# Patient Record
Sex: Female | Born: 1965 | Race: White | Hispanic: No | Marital: Married | Smoking: Never smoker
Health system: Southern US, Community
[De-identification: ages and names within clinical notes are randomized; demographics above are authoritative.]

## PROBLEM LIST (undated history)

## (undated) DIAGNOSIS — Z8742 Personal history of other diseases of the female genital tract: Secondary | ICD-10-CM

## (undated) DIAGNOSIS — F909 Attention-deficit hyperactivity disorder, unspecified type: Secondary | ICD-10-CM

## (undated) DIAGNOSIS — F329 Major depressive disorder, single episode, unspecified: Secondary | ICD-10-CM

## (undated) DIAGNOSIS — F32A Depression, unspecified: Secondary | ICD-10-CM

## (undated) DIAGNOSIS — Z973 Presence of spectacles and contact lenses: Secondary | ICD-10-CM

## (undated) DIAGNOSIS — G43909 Migraine, unspecified, not intractable, without status migrainosus: Secondary | ICD-10-CM

## (undated) DIAGNOSIS — Z8669 Personal history of other diseases of the nervous system and sense organs: Secondary | ICD-10-CM

## (undated) DIAGNOSIS — I10 Essential (primary) hypertension: Secondary | ICD-10-CM

## (undated) DIAGNOSIS — J45909 Unspecified asthma, uncomplicated: Secondary | ICD-10-CM

## (undated) DIAGNOSIS — B019 Varicella without complication: Secondary | ICD-10-CM

## (undated) HISTORY — DX: Depression, unspecified: F32.A

## (undated) HISTORY — DX: Varicella without complication: B01.9

## (undated) HISTORY — DX: Migraine, unspecified, not intractable, without status migrainosus: G43.909

## (undated) HISTORY — DX: Major depressive disorder, single episode, unspecified: F32.9

## (undated) HISTORY — DX: Unspecified asthma, uncomplicated: J45.909

## (undated) HISTORY — DX: Essential (primary) hypertension: I10

## (undated) HISTORY — PX: TONSILLECTOMY: SUR1361

---
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
## 1997-11-10 ENCOUNTER — Inpatient Hospital Stay (HOSPITAL_COMMUNITY): Admission: AD | Admit: 1997-11-10 | Discharge: 1997-11-13 | Payer: Self-pay | Admitting: *Deleted

## 1997-11-15 ENCOUNTER — Encounter (HOSPITAL_COMMUNITY): Admission: RE | Admit: 1997-11-15 | Discharge: 1998-02-13 | Payer: Self-pay | Admitting: *Deleted

## 1997-11-16 ENCOUNTER — Inpatient Hospital Stay (HOSPITAL_COMMUNITY): Admission: EM | Admit: 1997-11-16 | Discharge: 1997-11-18 | Payer: Self-pay | Admitting: Emergency Medicine

## 1997-12-17 ENCOUNTER — Other Ambulatory Visit: Admission: RE | Admit: 1997-12-17 | Discharge: 1997-12-17 | Payer: Self-pay | Admitting: Obstetrics and Gynecology

## 1999-07-20 ENCOUNTER — Other Ambulatory Visit: Admission: RE | Admit: 1999-07-20 | Discharge: 1999-07-20 | Payer: Self-pay | Admitting: *Deleted

## 2000-08-27 ENCOUNTER — Other Ambulatory Visit: Admission: RE | Admit: 2000-08-27 | Discharge: 2000-08-27 | Payer: Self-pay | Admitting: Obstetrics and Gynecology

## 2001-09-02 ENCOUNTER — Other Ambulatory Visit: Admission: RE | Admit: 2001-09-02 | Discharge: 2001-09-02 | Payer: Self-pay | Admitting: Obstetrics and Gynecology

## 2003-04-27 ENCOUNTER — Other Ambulatory Visit: Admission: RE | Admit: 2003-04-27 | Discharge: 2003-04-27 | Payer: Self-pay | Admitting: Obstetrics and Gynecology

## 2004-06-08 ENCOUNTER — Ambulatory Visit: Payer: Self-pay | Admitting: Gastroenterology

## 2005-11-09 ENCOUNTER — Ambulatory Visit: Payer: Self-pay | Admitting: Unknown Physician Specialty

## 2006-02-21 ENCOUNTER — Ambulatory Visit (HOSPITAL_BASED_OUTPATIENT_CLINIC_OR_DEPARTMENT_OTHER): Admission: RE | Admit: 2006-02-21 | Discharge: 2006-02-21 | Payer: Self-pay | Admitting: Obstetrics and Gynecology

## 2007-12-31 ENCOUNTER — Ambulatory Visit: Payer: Self-pay | Admitting: Specialist

## 2014-09-23 ENCOUNTER — Emergency Department: Payer: Self-pay | Admitting: Emergency Medicine

## 2015-03-15 ENCOUNTER — Encounter: Payer: Self-pay | Admitting: Nurse Practitioner

## 2015-03-15 ENCOUNTER — Encounter (INDEPENDENT_AMBULATORY_CARE_PROVIDER_SITE_OTHER): Payer: Self-pay

## 2015-03-15 ENCOUNTER — Ambulatory Visit (INDEPENDENT_AMBULATORY_CARE_PROVIDER_SITE_OTHER): Payer: BC Managed Care – PPO | Admitting: Nurse Practitioner

## 2015-03-15 VITALS — BP 170/112 | HR 83 | Temp 97.9°F | Resp 14 | Ht 62.0 in | Wt 107.4 lb

## 2015-03-15 DIAGNOSIS — Z0000 Encounter for general adult medical examination without abnormal findings: Secondary | ICD-10-CM | POA: Insufficient documentation

## 2015-03-15 DIAGNOSIS — Z7689 Persons encountering health services in other specified circumstances: Secondary | ICD-10-CM

## 2015-03-15 DIAGNOSIS — R229 Localized swelling, mass and lump, unspecified: Secondary | ICD-10-CM | POA: Diagnosis not present

## 2015-03-15 DIAGNOSIS — G43809 Other migraine, not intractable, without status migrainosus: Secondary | ICD-10-CM

## 2015-03-15 DIAGNOSIS — F909 Attention-deficit hyperactivity disorder, unspecified type: Secondary | ICD-10-CM | POA: Diagnosis not present

## 2015-03-15 DIAGNOSIS — Z7189 Other specified counseling: Secondary | ICD-10-CM

## 2015-03-15 DIAGNOSIS — IMO0001 Reserved for inherently not codable concepts without codable children: Secondary | ICD-10-CM

## 2015-03-15 DIAGNOSIS — R030 Elevated blood-pressure reading, without diagnosis of hypertension: Secondary | ICD-10-CM

## 2015-03-15 MED ORDER — AMLODIPINE BESYLATE 5 MG PO TABS: 5.0000 mg | ORAL_TABLET | Freq: Every day | ORAL | Status: DC

## 2015-03-15 MED ORDER — RIZATRIPTAN BENZOATE 5 MG PO TBDP: 5.0000 mg | ORAL_TABLET | ORAL | Status: DC | PRN

## 2015-03-15 NOTE — Progress Notes (Signed)
Subjective:    Patient ID: Katherine Sharp, female    DOB: 1965/08/15, 49 y.o.   MRN: [id number]  HPI  Katherine Sharp is a 49 yo female establishing care and a CC of knots on fingers and left elbow x a few years ago.   1) New pt info-   Immunizations- tdap unsure of date  Mammogram- 03/18/15 scheduled   Pap- 2014 normal per pt- [facility]  Eye Exam- 09/11/14, normal (glasses/contacts)   Dental Exam- UTD  2) Chronic Problems-  Depression- Dr. Robina Ade every 6 mos, doing well on Lexapro   ADHD- Adderall through Dr. Robina Ade   Migraines- left side primarily, eye to back of head. Tried Maxalt with relief in the past, no auras she reports.   Asthma- No recent exacerbations   HTN- rarely high she reports   3) Acute Problems-  Knots on fingers- left 2nd finger, right hand long finger sore if picking something up. Hx of cysts in different places on body. Right hand dominant   Review of Systems  Constitutional: Negative for fever, chills, diaphoresis and fatigue.  Respiratory: Negative for chest tightness, shortness of breath and wheezing.   Cardiovascular: Negative for chest pain, palpitations and leg swelling.  Gastrointestinal: Negative for nausea, vomiting and diarrhea.  Skin: Negative for rash.  Neurological: Positive for headaches. Negative for dizziness, weakness and numbness.  Psychiatric/Behavioral: The patient is not nervous/anxious.    Past Medical History  Diagnosis Date  . Asthma   . Depression   . Hypertension   . Chicken pox   . Migraines     History   Social History  . Marital Status: Married    Spouse Name: N/A  . Number of Children: N/A  . Years of Education: N/A   Occupational History  . Not on file.   Social History Main Topics  . Smoking status: Never Smoker   . Smokeless tobacco: Never Used  . Alcohol Use: 4.2 oz/week    7 Glasses of wine per week  . Drug Use: No  . Sexual Activity:    Partners: Male    Birth Control/ Protection: Surgical     Comment:  Vasectomy   Other Topics Concern  . 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    Pets: 3 cats inside and 1 dog inside        Past Surgical History  Procedure Laterality Date  . Appendectomy  1984  . Tonsillectomy and adenoidectomy  1970    Family History  Problem Relation Age of Onset  . Early death Mother     Brain Tumor  . Cancer Maternal Grandmother     Uterus, Bladder, Lung and Colon    Allergies  Allergen Reactions  . Nsaids Nausea Only    Abdominal pains and indigestion    No current outpatient prescriptions on file prior to visit.   No current facility-administered medications on file prior to visit.      Objective:   Physical Exam  Constitutional: She is oriented to person, place, and time. She appears well-developed and well-nourished. No distress.  BP 170/112 mmHg  Pulse 83  Temp(Src) 97.9 F (36.6 C)  Resp 14  Ht 5\' 2"  (1.575 m)  Wt 107 lb 6.4 oz (48.716 kg)  BMI 19.64 kg/m2  SpO2 99%   HENT:  Head: Normocephalic and atraumatic.    Right Ear: External ear normal.  Left Ear: External ear normal.  Cardiovascular: Normal rate, regular rhythm and normal heart sounds.   Pulmonary/Chest: Effort normal and breath sounds normal. No respiratory distress. She has no wheezes. She has no rales. She exhibits no tenderness.  Musculoskeletal: Normal range of motion. She exhibits no edema or tenderness.       Arms: Neurological: She is alert and oriented to person, place, and time. No cranial nerve deficit. She exhibits normal muscle tone. Coordination normal.  Skin: Skin is warm and dry. No rash noted. She is not diaphoretic.  Psychiatric: She has a normal mood and affect. Her behavior is normal. Judgment and thought content normal.      Assessment & Plan:

## 2015-03-15 NOTE — Patient Instructions (Signed)
Please make an appointment for a physical (you will have fasting labs- nothing to eat or drink after midnight except water and black coffee).   Welcome to [facility]! Nice to meet you!

## 2015-03-15 NOTE — Assessment & Plan Note (Signed)
Discussed acute and chronic issues. Reviewed health maintenance measures, PFSHx, and immunizations. Obtain records from previous facilities.

## 2015-03-15 NOTE — Assessment & Plan Note (Addendum)
Nodules stable on left hand DIP joint 2nd finger, DIP joint on right long finger, right shoulder under the skin, 1 removed on left lateral bridge of nose. Cysts are all 2 mm approx (look to be mucous cysts). Will follow.

## 2015-03-15 NOTE — Progress Notes (Signed)
Pre visit review using our clinic review tool, if applicable. No additional management support is needed unless otherwise documented below in the visit note. 

## 2015-03-18 LAB — HM MAMMOGRAPHY: HM Mammogram: NEGATIVE

## 2015-03-22 DIAGNOSIS — G43909 Migraine, unspecified, not intractable, without status migrainosus: Secondary | ICD-10-CM | POA: Insufficient documentation

## 2015-03-22 DIAGNOSIS — I10 Essential (primary) hypertension: Secondary | ICD-10-CM | POA: Insufficient documentation

## 2015-03-22 DIAGNOSIS — F909 Attention-deficit hyperactivity disorder, unspecified type: Secondary | ICD-10-CM | POA: Insufficient documentation

## 2015-03-22 NOTE — Assessment & Plan Note (Addendum)
Pt sees Dr. Toy Care. Stable on current medications.

## 2015-03-22 NOTE — Assessment & Plan Note (Signed)
Stable currently. Maxalt is helpful for pt. Will follow.

## 2015-03-22 NOTE — Assessment & Plan Note (Signed)
Rarely high pt reports. Today it is really elevated. Will start on Norvasc 5 mg and FU in 1 week.

## 2015-03-25 ENCOUNTER — Ambulatory Visit (INDEPENDENT_AMBULATORY_CARE_PROVIDER_SITE_OTHER): Payer: BC Managed Care – PPO

## 2015-03-25 ENCOUNTER — Telehealth: Payer: Self-pay

## 2015-03-25 VITALS — BP 124/90 | HR 76

## 2015-03-25 DIAGNOSIS — I10 Essential (primary) hypertension: Secondary | ICD-10-CM

## 2015-03-25 DIAGNOSIS — R030 Elevated blood-pressure reading, without diagnosis of hypertension: Secondary | ICD-10-CM

## 2015-03-25 DIAGNOSIS — IMO0001 Reserved for inherently not codable concepts without codable children: Secondary | ICD-10-CM

## 2015-03-25 NOTE — Telephone Encounter (Signed)
Pt came in for a BP check. Her BP was 124/90, pulse 76. Her mom bought her a BP machine and she has been taking it at home and her numbers were looking pretty good. She wanted to let you know that her doctor told her to stop taking her Adderall because she was thinking this is why her BP is running high. She is wondering if she needs to keep taking the BP meds. 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

## 2015-03-25 NOTE — Progress Notes (Signed)
Pt presented for blood pressure check. BP was 124/90 after and pulse was 76 after sitting for 5 minutes.

## 2015-03-26 NOTE — Addendum Note (Signed)
Addended by: Rubbie Battiest on: 03/26/2015 02:09 PM   Modules accepted: Miquel Dunn

## 2015-03-26 NOTE — Telephone Encounter (Signed)
LMTCB about BP meds

## 2015-03-26 NOTE — Progress Notes (Signed)
I agree with Ernestene Mention note. Will send a quick note regarding further instructions.   Lorane Gell, AGNP-C 03/26/15

## 2015-03-26 NOTE — Telephone Encounter (Signed)
We can try a higher dosage of her BP medication (since she's at a low dose) and see if that works with her Adderall. She will still need to take her BP medication regardless.

## 2015-03-29 NOTE — Telephone Encounter (Signed)
Pt is in [place] and will be back on Wednesday and is going to talk to Dr Robina Ade about her Adderall and get back to [place] on Wednesday.

## 2015-03-29 NOTE — Telephone Encounter (Signed)
LMTCB about medications

## 2015-05-04 ENCOUNTER — Telehealth: Payer: Self-pay | Admitting: Nurse Practitioner

## 2015-05-04 ENCOUNTER — Other Ambulatory Visit: Payer: Self-pay | Admitting: Nurse Practitioner

## 2015-05-04 DIAGNOSIS — Z0000 Encounter for general adult medical examination without abnormal findings: Secondary | ICD-10-CM

## 2015-05-04 NOTE — Telephone Encounter (Signed)
Orders placed. Thanks.

## 2015-05-04 NOTE — Telephone Encounter (Signed)
Pt states she needs to get fasting lab work. Need orders please. Thank You!

## 2015-05-10 ENCOUNTER — Other Ambulatory Visit (INDEPENDENT_AMBULATORY_CARE_PROVIDER_SITE_OTHER): Payer: BC Managed Care – PPO

## 2015-05-10 DIAGNOSIS — Z0000 Encounter for general adult medical examination without abnormal findings: Secondary | ICD-10-CM

## 2015-05-10 LAB — CBC WITH DIFFERENTIAL/PLATELET
BASOS ABS: 0 10*3/uL (ref 0.0–0.1)
BASOS PCT: 0.7 % (ref 0.0–3.0)
Eosinophils Absolute: 0.1 10*3/uL (ref 0.0–0.7)
Eosinophils Relative: 1.4 % (ref 0.0–5.0)
HCT: 37.5 % (ref 36.0–46.0)
Hemoglobin: 12.6 g/dL (ref 12.0–15.0)
LYMPHS ABS: 1 10*3/uL (ref 0.7–4.0)
Lymphocytes Relative: 25.7 % (ref 12.0–46.0)
MCHC: 33.6 g/dL (ref 30.0–36.0)
MCV: 84.7 fl (ref 78.0–100.0)
MONOS PCT: 7.5 % (ref 3.0–12.0)
Monocytes Absolute: 0.3 10*3/uL (ref 0.1–1.0)
NEUTROS ABS: 2.6 10*3/uL (ref 1.4–7.7)
NEUTROS PCT: 64.7 % (ref 43.0–77.0)
PLATELETS: 194 10*3/uL (ref 150.0–400.0)
RBC: 4.43 Mil/uL (ref 3.87–5.11)
RDW: 14.7 % (ref 11.5–15.5)
WBC: 4.1 10*3/uL (ref 4.0–10.5)

## 2015-05-10 LAB — LIPID PANEL
CHOL/HDL RATIO: 2
CHOLESTEROL: 175 mg/dL (ref 0–200)
HDL: 76 mg/dL (ref >39.00)
LDL Cholesterol: 84 mg/dL (ref 0–99)
NonHDL: 98.93
TRIGLYCERIDES: 75 mg/dL (ref 0.0–149.0)
VLDL: 15 mg/dL (ref 0.0–40.0)

## 2015-05-10 LAB — COMPREHENSIVE METABOLIC PANEL
ALBUMIN: 4.5 g/dL (ref 3.5–5.2)
ALT: 13 U/L (ref 0–35)
AST: 19 U/L (ref 0–37)
Alkaline Phosphatase: 42 U/L (ref 39–117)
BUN: 13 mg/dL (ref 6–23)
CHLORIDE: 105 meq/L (ref 96–112)
CO2: 26 mEq/L (ref 19–32)
CREATININE: 0.69 mg/dL (ref 0.40–1.20)
Calcium: 9.1 mg/dL (ref 8.4–10.5)
GFR: 95.88 mL/min (ref >60.00)
GLUCOSE: 88 mg/dL (ref 70–99)
Potassium: 4.7 mEq/L (ref 3.5–5.1)
SODIUM: 139 meq/L (ref 135–145)
TOTAL PROTEIN: 6.6 g/dL (ref 6.0–8.3)
Total Bilirubin: 0.6 mg/dL (ref 0.2–1.2)

## 2015-05-10 LAB — TSH: TSH: 0.84 u[IU]/mL (ref 0.35–4.50)

## 2015-05-11 ENCOUNTER — Other Ambulatory Visit: Payer: BC Managed Care – PPO

## 2015-07-09 ENCOUNTER — Other Ambulatory Visit: Payer: Self-pay | Admitting: Nurse Practitioner

## 2015-08-16 ENCOUNTER — Other Ambulatory Visit: Payer: Self-pay | Admitting: Nurse Practitioner

## 2015-08-20 ENCOUNTER — Ambulatory Visit (INDEPENDENT_AMBULATORY_CARE_PROVIDER_SITE_OTHER): Payer: BC Managed Care – PPO | Admitting: Nurse Practitioner

## 2015-08-20 ENCOUNTER — Ambulatory Visit: Payer: BC Managed Care – PPO | Admitting: Nurse Practitioner

## 2015-08-20 VITALS — BP 138/90 | HR 87 | Temp 98.7°F | Ht 62.0 in | Wt 115.0 lb

## 2015-08-20 DIAGNOSIS — F909 Attention-deficit hyperactivity disorder, unspecified type: Secondary | ICD-10-CM | POA: Diagnosis not present

## 2015-08-20 DIAGNOSIS — R6884 Jaw pain: Secondary | ICD-10-CM | POA: Diagnosis not present

## 2015-08-20 DIAGNOSIS — R030 Elevated blood-pressure reading, without diagnosis of hypertension: Secondary | ICD-10-CM | POA: Diagnosis not present

## 2015-08-20 DIAGNOSIS — IMO0001 Reserved for inherently not codable concepts without codable children: Secondary | ICD-10-CM

## 2015-08-20 MED ORDER — HYDROCHLOROTHIAZIDE 12.5 MG PO TABS: 12.5000 mg | ORAL_TABLET | Freq: Every day | ORAL | Status: DC

## 2015-08-20 MED ORDER — FLUTICASONE PROPIONATE 50 MCG/ACT NA SUSP: 2.0000 | Freq: Every day | NASAL | Status: DC

## 2015-08-20 NOTE — Patient Instructions (Addendum)
Please follow up in 2 weeks for follow up.   Try flonase.   Take the HCTZ instead of the amlodipine.

## 2015-08-20 NOTE — Progress Notes (Signed)
Patient ID: Katherine Sharp, female    DOB: 1966/04/20  Age: 50 y.o. MRN: [id number]  CC: Jaw Pain   HPI Katherine Sharp presents for CC of pain right lower jaw, HTN, and medication for ADHD.  1) 2 days ago lasted for 1 day TMJ history  Electric sensation- overly sensitive feeling, then it went away    Adjusting her mouth guard   Radiated up right side of head   Happened once prior   Ringing of the ears on the right  2) Went back to 15 mg of adderall, doing well at this level  3) 145/105 BP highest at home  Does not like amlodipine  History Katherine Sharp has a past medical history of Asthma; Depression; Hypertension; Chicken pox; and Migraines.   She has past surgical history that includes Appendectomy (1984) and Tonsillectomy and adenoidectomy (1970).   Her family history includes Cancer in her maternal grandmother; Early death in her mother.She reports that she has never smoked. She has never used smokeless tobacco. She reports that she drinks about 4.2 oz of alcohol per week. She reports that she does not use illicit drugs.  Outpatient Prescriptions Prior to Visit  Medication Sig Dispense Refill  . escitalopram (LEXAPRO) 20 MG tablet Take 1 tablet by mouth daily.    Marland Kitchen amLODipine (NORVASC) 5 MG tablet TAKE 1 TABLET(5 MG) BY MOUTH DAILY 30 tablet 1  . amphetamine-dextroamphetamine (ADDERALL XR) 30 MG 24 hr capsule Take 1 capsule by mouth daily.   0  . rizatriptan (MAXALT-MLT) 5 MG disintegrating tablet Take 1 tablet (5 mg total) by mouth as needed for migraine. May repeat in 2 hours if needed (Patient not taking: Reported on 08/20/2015) 10 tablet 1   No facility-administered medications prior to visit.    ROS Review of Systems  Constitutional: Negative for fever, chills, diaphoresis and fatigue.  HENT: Negative for facial swelling.   Respiratory: Negative for chest tightness, shortness of breath and wheezing.   Cardiovascular: Negative for chest pain, palpitations and leg swelling.   Gastrointestinal: Negative for nausea, vomiting and diarrhea.  Musculoskeletal: Positive for arthralgias.       Jaw pain  Skin: Negative for rash.  Neurological: Negative for dizziness, numbness and headaches.  Psychiatric/Behavioral: Positive for decreased concentration. The patient is not nervous/anxious.     Objective:  BP 138/90 mmHg  Pulse 87  Temp(Src) 98.7 F (37.1 C)  Ht 5\' 2"  (1.575 m)  Wt 115 lb (52.164 kg)  BMI 21.03 kg/m2  SpO2 97%  Physical Exam  Constitutional: She is oriented to person, place, and time. She appears well-developed and well-nourished. No distress.  HENT:  Head: Normocephalic and atraumatic.  Right Ear: External ear normal.  Left Ear: External ear normal.  Mouth/Throat: No oropharyngeal exudate.  No tenderness to palpation of jaw  Eyes: Right eye exhibits no discharge. Left eye exhibits no discharge. No scleral icterus.  Neck: Normal range of motion. Neck supple.  Cardiovascular: Normal rate, regular rhythm and normal heart sounds.  Exam reveals no gallop and no friction rub.   No murmur heard. Pulmonary/Chest: Effort normal and breath sounds normal. No respiratory distress. She has no wheezes. She has no rales. She exhibits no tenderness.  Lymphadenopathy:    She has no cervical adenopathy.  Neurological: She is alert and oriented to person, place, and time. No cranial nerve deficit. She exhibits normal muscle tone. Coordination normal.  Skin: Skin is warm and dry. No rash noted. She is not diaphoretic.  Psychiatric:  She has a normal mood and affect. Her behavior is normal. Judgment and thought content normal.      Assessment & Plan:   Katherine Sharp was seen today for jaw pain.  Diagnoses and all orders for this visit:  Elevated blood pressure  Attention deficit hyperactivity disorder (ADHD), unspecified ADHD type  Pain in lower jaw  Other orders -     hydrochlorothiazide (HYDRODIURIL) 12.5 MG tablet; Take 1 tablet (12.5 mg total) by mouth  daily. -     fluticasone (FLONASE) 50 MCG/ACT nasal spray; Place 2 sprays into both nostrils daily.  I have discontinued Ms. Scheeler's amLODipine. I am also having her start on hydrochlorothiazide and fluticasone. Additionally, I am having her maintain her escitalopram, rizatriptan, and amphetamine-dextroamphetamine.  Meds ordered this encounter  Medications  . hydrochlorothiazide (HYDRODIURIL) 12.5 MG tablet    Sig: Take 1 tablet (12.5 mg total) by mouth daily.    Dispense:  30 tablet    Refill:  1    Order Specific Question:  Supervising Provider    Answer:  Deborra Medina L [[id number]]  . fluticasone (FLONASE) 50 MCG/ACT nasal spray    Sig: Place 2 sprays into both nostrils daily.    Dispense:  16 g    Refill:  6    Order Specific Question:  Supervising Provider    Answer:  Deborra Medina L [[id number]]  . amphetamine-dextroamphetamine (ADDERALL XR) 15 MG 24 hr capsule    Sig: Take 15 mg by mouth every morning.     Follow-up: Return in about 2 weeks (around 09/03/2015) for HTN follow up .

## 2015-08-29 ENCOUNTER — Encounter: Payer: Self-pay | Admitting: Nurse Practitioner

## 2015-08-29 DIAGNOSIS — R6884 Jaw pain: Secondary | ICD-10-CM | POA: Insufficient documentation

## 2015-08-29 NOTE — Assessment & Plan Note (Signed)
Dr. Toy Care moved her to 15 mg of Adderall XL Stable

## 2015-08-29 NOTE — Assessment & Plan Note (Addendum)
BP Readings from Last 3 Encounters:  08/20/15 138/90  03/25/15 124/90  03/15/15 170/112   Does not care for norvasc  Will try HCTZ 12.5 mg daily in the morning FU in 2 weeks

## 2015-08-29 NOTE — Assessment & Plan Note (Signed)
New onset Radiating electric sensation of right side of jaw (to head)  Will try flonase to see if ear congestion responds  Try the new guard adjustment FU prn worsening/failure to improve.

## 2015-09-03 ENCOUNTER — Ambulatory Visit: Payer: BC Managed Care – PPO | Admitting: Nurse Practitioner

## 2015-09-09 ENCOUNTER — Ambulatory Visit (INDEPENDENT_AMBULATORY_CARE_PROVIDER_SITE_OTHER): Payer: BC Managed Care – PPO | Admitting: Nurse Practitioner

## 2015-09-09 ENCOUNTER — Encounter: Payer: Self-pay | Admitting: Nurse Practitioner

## 2015-09-09 VITALS — BP 128/84 | HR 100 | Temp 98.0°F | Resp 16 | Ht 62.0 in | Wt 113.8 lb

## 2015-09-09 DIAGNOSIS — IMO0001 Reserved for inherently not codable concepts without codable children: Secondary | ICD-10-CM

## 2015-09-09 DIAGNOSIS — Z1211 Encounter for screening for malignant neoplasm of colon: Secondary | ICD-10-CM

## 2015-09-09 DIAGNOSIS — R6884 Jaw pain: Secondary | ICD-10-CM

## 2015-09-09 DIAGNOSIS — R030 Elevated blood-pressure reading, without diagnosis of hypertension: Secondary | ICD-10-CM

## 2015-09-09 DIAGNOSIS — Z8619 Personal history of other infectious and parasitic diseases: Secondary | ICD-10-CM

## 2015-09-09 DIAGNOSIS — F909 Attention-deficit hyperactivity disorder, unspecified type: Secondary | ICD-10-CM | POA: Diagnosis not present

## 2015-09-09 MED ORDER — VALACYCLOVIR HCL 1 G PO TABS: ORAL_TABLET | ORAL | Status: DC

## 2015-09-09 NOTE — Progress Notes (Signed)
Patient ID: Katherine Sharp, female    DOB: 1965/12/17  Age: 50 y.o. MRN: [id number]  CC: Follow-up   HPI Katherine Sharp presents for follow HTN, medication refills, and referral for colonoscopy.   1) Only 1-2 days of 140-150/80's-90's Doing well otherwise.  BP Readings from Last 3 Encounters:  09/09/15 128/84  08/20/15 138/90  03/25/15 124/90   2) Would like referral for colonoscopy- history of breast and colon cancer in family. Pt is turning 50 next month.   3) Needs Valtrex and Xanax filled today.   History Katherine Sharp has a past medical history of Asthma; Depression; Hypertension; Chicken pox; and Migraines.   Katherine Sharp has past surgical history that includes Appendectomy (1984) and Tonsillectomy and adenoidectomy (1970).   Katherine Sharp family history includes Cancer in Katherine Sharp maternal grandmother; Early death in Katherine Sharp mother.Katherine Sharp reports that Katherine Sharp has never smoked. Katherine Sharp has never used smokeless tobacco. Katherine Sharp reports that Katherine Sharp drinks about 4.2 oz of alcohol per week. Katherine Sharp reports that Katherine Sharp does not use illicit drugs.  Outpatient Prescriptions Prior to Visit  Medication Sig Dispense Refill  . amphetamine-dextroamphetamine (ADDERALL XR) 15 MG 24 hr capsule Take 15 mg by mouth every morning. Reported on 09/09/2015    . escitalopram (LEXAPRO) 20 MG tablet Take 1 tablet by mouth daily.    . fluticasone (FLONASE) 50 MCG/ACT nasal spray Place 2 sprays into both nostrils daily. 16 g 6  . hydrochlorothiazide (HYDRODIURIL) 12.5 MG tablet Take 1 tablet (12.5 mg total) by mouth daily. 30 tablet 1  . rizatriptan (MAXALT-MLT) 5 MG disintegrating tablet Take 1 tablet (5 mg total) by mouth as needed for migraine. May repeat in 2 hours if needed 10 tablet 1   No facility-administered medications prior to visit.    ROS Review of Systems  Constitutional: Negative for fever, chills, diaphoresis and fatigue.  Respiratory: Negative for cough, chest tightness, shortness of breath and wheezing.   Cardiovascular: Negative for chest  pain, palpitations and leg swelling.  Neurological: Negative for dizziness and headaches.  Psychiatric/Behavioral: Positive for decreased concentration. The patient is not nervous/anxious.     Objective:  BP 128/84 mmHg  Pulse 100  Temp(Src) 98 F (36.7 C) (Oral)  Resp 16  Ht 5\' 2"  (1.575 m)  Wt 113 lb 12.8 oz (51.619 kg)  BMI 20.81 kg/m2  SpO2 97%  LMP 08/12/2015  Physical Exam  Constitutional: Katherine Sharp is oriented to person, place, and time. Katherine Sharp appears well-developed and well-nourished. No distress.  HENT:  Head: Normocephalic and atraumatic.  Right Ear: External ear normal.  Left Ear: External ear normal.  Cardiovascular: Normal rate, regular rhythm and normal heart sounds.  Exam reveals no gallop and no friction rub.   No murmur heard. Pulmonary/Chest: Effort normal and breath sounds normal. No respiratory distress. Katherine Sharp has no wheezes. Katherine Sharp has no rales. Katherine Sharp exhibits no tenderness.  Neurological: Katherine Sharp is alert and oriented to person, place, and time. No cranial nerve deficit. Katherine Sharp exhibits normal muscle tone. Coordination normal.  Skin: Skin is warm and dry. No rash noted. Katherine Sharp is not diaphoretic.  Psychiatric: Katherine Sharp has a normal mood and affect. Katherine Sharp behavior is normal. Judgment and thought content normal.   Assessment & Plan:   Katherine Sharp was seen today for follow-up.  Diagnoses and all orders for this visit:  Special screening for malignant neoplasms, colon -     Ambulatory referral to Gastroenterology  Elevated blood pressure  Pain in lower jaw  Attention deficit hyperactivity disorder (ADHD), unspecified ADHD type  H/O  cold sores  Other orders -     valACYclovir (VALTREX) 1000 MG tablet; Take two tablets at first sign of fever blister then repeat in 12 hours   I am having Katherine Sharp start on valACYclovir. I am also having Katherine Sharp maintain Katherine Sharp escitalopram, rizatriptan, hydrochlorothiazide, fluticasone, amphetamine-dextroamphetamine, amphetamine-dextroamphetamine, and  ALPRAZolam.  Meds ordered this encounter  Medications  . amphetamine-dextroamphetamine (ADDERALL XR) 30 MG 24 hr capsule    Sig: Reported on 09/09/2015    Refill:  0  . ALPRAZolam (XANAX) 0.25 MG tablet    Sig: TK 1 T PO TID PRN    Refill:  2  . valACYclovir (VALTREX) 1000 MG tablet    Sig: Take two tablets at first sign of fever blister then repeat in 12 hours    Dispense:  20 tablet    Refill:  0    Order Specific Question:  Supervising Provider    Answer:  Crecencio Mc [[id number]]     Follow-up: Return in about 6 months (around 03/08/2016) for Follow up .

## 2015-09-09 NOTE — Patient Instructions (Signed)
See you in 6 months!

## 2015-09-13 DIAGNOSIS — Z8619 Personal history of other infectious and parasitic diseases: Secondary | ICD-10-CM | POA: Insufficient documentation

## 2015-09-13 NOTE — Assessment & Plan Note (Signed)
Stable. Pt being seen by her psychiatrist. Taking xanax at night for sleep. Stable. Will follow

## 2015-09-13 NOTE — Assessment & Plan Note (Signed)
BP Readings from Last 3 Encounters:  09/09/15 128/84  08/20/15 138/90  03/25/15 124/90   Stable. Will follow

## 2015-09-13 NOTE — Assessment & Plan Note (Signed)
Recent cold sore  Needs refill on valtrex

## 2015-09-13 NOTE — Assessment & Plan Note (Signed)
Resolved. New mouth guard helpful

## 2015-10-12 ENCOUNTER — Other Ambulatory Visit: Payer: Self-pay | Admitting: Nurse Practitioner

## 2015-10-16 ENCOUNTER — Other Ambulatory Visit: Payer: Self-pay | Admitting: Nurse Practitioner

## 2015-11-14 ENCOUNTER — Other Ambulatory Visit: Payer: Self-pay | Admitting: Nurse Practitioner

## 2016-02-10 ENCOUNTER — Other Ambulatory Visit: Payer: Self-pay | Admitting: Nurse Practitioner

## 2016-02-10 NOTE — Telephone Encounter (Signed)
Last refilled in 11/2015. Last seen 09/09/15. Please advise?

## 2016-02-10 NOTE — Telephone Encounter (Signed)
[facility] called regarding pt medication. I did ask the pharmacist to contact pt to give [place] a call to sch with a provider. Thank you!

## 2016-03-10 ENCOUNTER — Other Ambulatory Visit: Payer: Self-pay | Admitting: Nurse Practitioner

## 2016-05-16 ENCOUNTER — Encounter: Payer: Self-pay | Admitting: Family

## 2016-05-16 ENCOUNTER — Ambulatory Visit (INDEPENDENT_AMBULATORY_CARE_PROVIDER_SITE_OTHER): Payer: BC Managed Care – PPO | Admitting: Family

## 2016-05-16 VITALS — BP 132/80 | HR 80 | Temp 97.9°F | Ht 62.0 in | Wt 110.2 lb

## 2016-05-16 DIAGNOSIS — J209 Acute bronchitis, unspecified: Secondary | ICD-10-CM

## 2016-05-16 DIAGNOSIS — Z1239 Encounter for other screening for malignant neoplasm of breast: Secondary | ICD-10-CM

## 2016-05-16 DIAGNOSIS — Z1231 Encounter for screening mammogram for malignant neoplasm of breast: Secondary | ICD-10-CM | POA: Diagnosis not present

## 2016-05-16 DIAGNOSIS — Z1211 Encounter for screening for malignant neoplasm of colon: Secondary | ICD-10-CM

## 2016-05-16 DIAGNOSIS — I10 Essential (primary) hypertension: Secondary | ICD-10-CM

## 2016-05-16 MED ORDER — ALBUTEROL SULFATE HFA 108 (90 BASE) MCG/ACT IN AERS: 2.0000 | INHALATION_SPRAY | Freq: Four times a day (QID) | RESPIRATORY_TRACT | 1 refills | Status: DC | PRN

## 2016-05-16 MED ORDER — PROMETHAZINE-DM 6.25-15 MG/5ML PO SYRP: 5.0000 mL | ORAL_SOLUTION | Freq: Every evening | ORAL | 1 refills | Status: DC | PRN

## 2016-05-16 MED ORDER — HYDROCHLOROTHIAZIDE 12.5 MG PO TABS: ORAL_TABLET | ORAL | 0 refills | Status: DC

## 2016-05-16 NOTE — Patient Instructions (Signed)

## 2016-05-16 NOTE — Assessment & Plan Note (Signed)
Controlled. Discontinued amlodipine due LE swelling and patient reports better control on HCTZ.Refilled HCTZ. Pending CMP.

## 2016-05-16 NOTE — Progress Notes (Signed)
Subjective:    Patient ID: Katherine Sharp, female    DOB: April 08, 1966, 50 y.o.   MRN: [id number]  CC: Katherine Sharp is a 50 y.o. female who presents today for an acute visit.    HPI: Patient here for acute visit with chief complaint of dry cough x one week. She would also like to establish Sharp with me. Hasn't tried any medications OTC. Endorses congestion, hoarseness.  A little more SOB wih cough noted when she is ascending stairs at work. Has had an inhaler in the past for cough, wheezing.  No fever, chills, wheezing.Denies exertional chest pain or pressure, numbness or tingling radiating to left arm or jaw, palpitations, dizziness, frequent headaches, changes in vision.   Nonsmoker. No lung disease.  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   Follows with ENT, Dr. Hayes Sharp.   Patient is due to come for CPE and will make that appointment at checkout. Will schedule mammogram with [facility] herself.    HISTORY:  Past Medical History:  Diagnosis Date  . Asthma   . Chicken pox   . Depression   . Hypertension   . Migraines    Past Surgical History:  Procedure Laterality Date  . APPENDECTOMY  1984  . TONSILLECTOMY AND ADENOIDECTOMY  1970   Family History  Problem Relation Age of Onset  . Early death Mother     Brain Tumor  . Cancer Maternal Grandmother     Uterus, Bladder, Lung and Colon    Allergies: Nsaids Current Outpatient Prescriptions on File Prior to Visit  Medication Sig Dispense Refill  . ALPRAZolam (XANAX) 0.25 MG tablet TK 1 T PO TID PRN  2  . amphetamine-dextroamphetamine (ADDERALL XR) 15 MG 24 hr capsule Take 15 mg by mouth every morning. Reported on 09/09/2015    . amphetamine-dextroamphetamine (ADDERALL XR) 30 MG 24 hr capsule Reported on 09/09/2015  0  . escitalopram (LEXAPRO) 20 MG tablet Take 1 tablet by mouth daily.    . fluticasone (FLONASE) 50 MCG/ACT nasal spray SHAKE LIQUID AND USE 2 SPRAYS IN EACH NOSTRIL DAILY 16 g 3    . rizatriptan (MAXALT-MLT) 5 MG disintegrating tablet Take 1 tablet (5 mg total) by mouth as needed for migraine. May repeat in 2 hours if needed 10 tablet 1  . valACYclovir (VALTREX) 1000 MG tablet Take two tablets at first sign of fever blister then repeat in 12 hours 20 tablet 0   No current facility-administered medications on file prior to visit.     Social History  Substance Use Topics  . Smoking status: Never Smoker  . Smokeless tobacco: Never Used  . Alcohol use 4.2 oz/week    7 Glasses of wine per week    Review of Systems  Constitutional: Negative for chills and fever.  HENT: Positive for voice change. Negative for congestion, rhinorrhea and sinus pressure.   Eyes: Negative for visual disturbance.  Respiratory: Positive for cough and shortness of breath. Negative for wheezing.   Cardiovascular: Negative for chest pain and palpitations.  Gastrointestinal: Negative for nausea and vomiting.  Neurological: Negative for dizziness and headaches.      Objective:    BP 132/80   Pulse 80   Temp 97.9 F (36.6 C) (Oral)   Ht 5\' 2"  (1.575 m)   Wt 110 lb 3.2 oz (50 kg)   SpO2 99%   BMI 20.16 kg/m    Physical Exam  Constitutional: She appears well-developed and well-nourished.  HENT:  Head: Normocephalic and atraumatic.  Right Ear: Hearing, tympanic membrane, external ear and ear canal normal. No drainage, swelling or tenderness. No foreign bodies. Tympanic membrane is not erythematous and not bulging. No middle ear effusion. No decreased hearing is noted.  Left Ear: Hearing, tympanic membrane, external ear and ear canal normal. No drainage, swelling or tenderness. No foreign bodies. Tympanic membrane is not erythematous and not bulging.  No middle ear effusion. No decreased hearing is noted.  Nose: Nose normal. No rhinorrhea. Right sinus exhibits no maxillary sinus tenderness and no frontal sinus tenderness. Left sinus exhibits no maxillary sinus tenderness and no frontal  sinus tenderness.  Mouth/Throat: Uvula is midline, oropharynx is clear and moist and mucous membranes are normal. No oropharyngeal exudate, posterior oropharyngeal edema, posterior oropharyngeal erythema or tonsillar abscesses.  Eyes: Conjunctivae are normal.  Cardiovascular: Regular rhythm, normal heart sounds and normal pulses.   Pulmonary/Chest: Effort normal and breath sounds normal. She has no wheezes. She has no rhonchi. She has no rales.  Lymphadenopathy:       Head (right side): No submental, no submandibular, no tonsillar, no preauricular, no posterior auricular and no occipital adenopathy present.       Head (left side): No submental, no submandibular, no tonsillar, no preauricular, no posterior auricular and no occipital adenopathy present.    She has no cervical adenopathy.  Neurological: She is alert.  Skin: Skin is warm and dry.  Psychiatric: She has a normal mood and affect. Her speech is normal and behavior is normal. Thought content normal.  Vitals reviewed.      Assessment & Plan:   Problem List Items Addressed This Visit      Cardiovascular and Mediastinum   Essential hypertension    Controlled. Discontinued amlodipine due LE swelling and patient reports better control on HCTZ.Refilled HCTZ. Pending CMP.       Relevant Medications   hydrochlorothiazide (HYDRODIURIL) 12.5 MG tablet   Other Relevant Orders   CBC with Differential/Platelet   Comprehensive metabolic panel   Hemoglobin A1c   Lipid panel   TSH   VITAMIN D 25 Hydroxy (Vit-D Deficiency, Fractures)     Respiratory   Acute bronchitis - Primary    Afebrile. Patient and I agreed on conservative therapy and delayed antibiotic treatment. Return precautions given.       Relevant Medications   albuterol (PROVENTIL HFA) 108 (90 Base) MCG/ACT inhaler   promethazine-dextromethorphan (PROMETHAZINE-DM) 6.25-15 MG/5ML syrup    Other Visit Diagnoses    Screen for colon cancer       Relevant Orders    Ambulatory referral to Gastroenterology   Screening for breast cancer       Relevant Orders   MM DIGITAL SCREENING BILATERAL         I have discontinued [name]'s amLODipine. I am also having her start on albuterol and promethazine-dextromethorphan. Additionally, I am having her maintain her escitalopram, rizatriptan, amphetamine-dextroamphetamine, amphetamine-dextroamphetamine, ALPRAZolam, valACYclovir, fluticasone, and hydrochlorothiazide.   Meds ordered this encounter  Medications  . hydrochlorothiazide (HYDRODIURIL) 12.5 MG tablet    Sig: TAKE 1 TABLET(12.5 MG) BY MOUTH DAILY    Dispense:  90 tablet    Refill:  0    Appointment needed with a new PCP to receive future refills.    Order Specific Question:   Supervising Provider    Answer:   Crecencio Mc [[id number]]  . albuterol (PROVENTIL HFA) 108 (90 Base) MCG/ACT inhaler    Sig: Inhale 2 puffs into the  lungs every 6 (six) hours as needed for wheezing or shortness of breath.    Dispense:  1 Inhaler    Refill:  1    Order Specific Question:   Supervising Provider    Answer:   Derrel Nip, TERESA L [[id number]]  . promethazine-dextromethorphan (PROMETHAZINE-DM) 6.25-15 MG/5ML syrup    Sig: Take 5 mLs by mouth at bedtime as needed for cough.    Dispense:  40 mL    Refill:  1    Order Specific Question:   Supervising Provider    Answer:   Crecencio Mc [[id number]]    Return precautions given.   Risks, benefits, and alternatives of the medications and treatment plan prescribed today were discussed, and patient expressed understanding.   Education regarding symptom management and diagnosis given to patient on AVS.  Continue to follow with Mable Paris, FNP for routine health maintenance.   [name] and I agreed with plan.   Mable Paris, FNP

## 2016-05-16 NOTE — Progress Notes (Signed)
Pre visit review using our clinic review tool, if applicable. No additional management support is needed unless otherwise documented below in the visit note. 

## 2016-05-16 NOTE — Assessment & Plan Note (Signed)
Afebrile. Patient and I agreed on conservative therapy and delayed antibiotic treatment. Return precautions given.

## 2016-05-18 NOTE — Progress Notes (Signed)
Patient has already scheduled appointment for CPE.  Also she has scheduled fasting lab appointment.

## 2016-05-24 ENCOUNTER — Other Ambulatory Visit (INDEPENDENT_AMBULATORY_CARE_PROVIDER_SITE_OTHER): Payer: BC Managed Care – PPO

## 2016-05-24 DIAGNOSIS — I10 Essential (primary) hypertension: Secondary | ICD-10-CM

## 2016-05-24 LAB — COMPREHENSIVE METABOLIC PANEL
ALBUMIN: 4.2 g/dL (ref 3.5–5.2)
ALK PHOS: 46 U/L (ref 39–117)
ALT: 14 U/L (ref 0–35)
AST: 18 U/L (ref 0–37)
BILIRUBIN TOTAL: 0.5 mg/dL (ref 0.2–1.2)
BUN: 17 mg/dL (ref 6–23)
CALCIUM: 9.2 mg/dL (ref 8.4–10.5)
CO2: 29 mEq/L (ref 19–32)
CREATININE: 0.73 mg/dL (ref 0.40–1.20)
Chloride: 102 mEq/L (ref 96–112)
GFR: 89.46 mL/min (ref >60.00)
Glucose, Bld: 93 mg/dL (ref 70–99)
Potassium: 4.7 mEq/L (ref 3.5–5.1)
SODIUM: 138 meq/L (ref 135–145)
TOTAL PROTEIN: 6.7 g/dL (ref 6.0–8.3)

## 2016-05-24 LAB — CBC WITH DIFFERENTIAL/PLATELET
BASOS PCT: 0.5 % (ref 0.0–3.0)
Basophils Absolute: 0 10*3/uL (ref 0.0–0.1)
EOS ABS: 0.1 10*3/uL (ref 0.0–0.7)
EOS PCT: 2 % (ref 0.0–5.0)
HEMATOCRIT: 37.8 % (ref 36.0–46.0)
HEMOGLOBIN: 12.9 g/dL (ref 12.0–15.0)
LYMPHS PCT: 27.3 % (ref 12.0–46.0)
Lymphs Abs: 1.3 10*3/uL (ref 0.7–4.0)
MCHC: 34.1 g/dL (ref 30.0–36.0)
MCV: 83 fl (ref 78.0–100.0)
MONO ABS: 0.4 10*3/uL (ref 0.1–1.0)
Monocytes Relative: 8.5 % (ref 3.0–12.0)
Neutro Abs: 2.9 10*3/uL (ref 1.4–7.7)
Neutrophils Relative %: 61.7 % (ref 43.0–77.0)
Platelets: 257 10*3/uL (ref 150.0–400.0)
RBC: 4.55 Mil/uL (ref 3.87–5.11)
RDW: 13.3 % (ref 11.5–15.5)
WBC: 4.7 10*3/uL (ref 4.0–10.5)

## 2016-05-24 LAB — LIPID PANEL
CHOLESTEROL: 164 mg/dL (ref 0–200)
HDL: 79.1 mg/dL (ref >39.00)
LDL Cholesterol: 73 mg/dL (ref 0–99)
NonHDL: 85.02
TRIGLYCERIDES: 59 mg/dL (ref 0.0–149.0)
Total CHOL/HDL Ratio: 2
VLDL: 11.8 mg/dL (ref 0.0–40.0)

## 2016-05-24 LAB — HEMOGLOBIN A1C: HEMOGLOBIN A1C: 5.1 % (ref 4.6–6.5)

## 2016-05-24 LAB — VITAMIN D 25 HYDROXY (VIT D DEFICIENCY, FRACTURES): VITD: 29.6 ng/mL — ABNORMAL LOW (ref 30.00–100.00)

## 2016-05-24 LAB — TSH: TSH: 0.86 u[IU]/mL (ref 0.35–4.50)

## 2016-05-26 ENCOUNTER — Encounter: Payer: Self-pay | Admitting: *Deleted

## 2016-07-17 ENCOUNTER — Encounter: Payer: BC Managed Care – PPO | Admitting: Family

## 2016-07-23 ENCOUNTER — Emergency Department
Admission: EM | Admit: 2016-07-23 | Discharge: 2016-07-23 | Disposition: A | Discharge status: Home / Self Care | Payer: BC Managed Care – PPO | Attending: Emergency Medicine | Admitting: Emergency Medicine

## 2016-07-23 DIAGNOSIS — I10 Essential (primary) hypertension: Secondary | ICD-10-CM | POA: Insufficient documentation

## 2016-07-23 DIAGNOSIS — H9202 Otalgia, left ear: Secondary | ICD-10-CM | POA: Diagnosis present

## 2016-07-23 DIAGNOSIS — J45909 Unspecified asthma, uncomplicated: Secondary | ICD-10-CM | POA: Insufficient documentation

## 2016-07-23 DIAGNOSIS — H7292 Unspecified perforation of tympanic membrane, left ear: Secondary | ICD-10-CM

## 2016-07-23 DIAGNOSIS — F909 Attention-deficit hyperactivity disorder, unspecified type: Secondary | ICD-10-CM | POA: Insufficient documentation

## 2016-07-23 DIAGNOSIS — Z79899 Other long term (current) drug therapy: Secondary | ICD-10-CM | POA: Insufficient documentation

## 2016-07-23 MED ORDER — AMOXICILLIN 875 MG PO TABS: 875.0000 mg | ORAL_TABLET | Freq: Two times a day (BID) | ORAL | 0 refills | Status: DC

## 2016-07-23 MED ORDER — AMOXICILLIN 500 MG PO CAPS
1000.0000 mg | ORAL_CAPSULE | Freq: Once | ORAL | Status: AC
Start: 2016-07-23 — End: 2016-07-23
  Administered 2016-07-23: 1000 mg via ORAL
  Filled 2016-07-23: qty 2

## 2016-07-23 MED ORDER — OFLOXACIN 0.3 % OT SOLN: 5.0000 [drp] | Freq: Two times a day (BID) | OTIC | 0 refills | Status: AC

## 2016-07-23 NOTE — ED Triage Notes (Signed)
Patient presents with left ear pain. States it has been hurting today and is now bleeding. States she feels a little dizzy and a little nauseated.

## 2016-07-23 NOTE — Discharge Instructions (Signed)
Do not get water into your ear. Please follow up with ENT for further eval of your TM rupture

## 2016-07-23 NOTE — ED Provider Notes (Signed)
[facility] Emergency Department Provider Note   ____________________________________________   First MD Initiated Contact with Patient 07/23/16 [phone]     (approximate)  I have reviewed the triage vital signs and the nursing notes.   HISTORY  Chief Complaint Otalgia    HPI Katherine Sharp is a 50 y.o. female who comes into the hospital today with some left-sided ear pain and bleeding from her left ear. The patient reports that she's had some severe pain to her left ear. She reports that since the bleeding has started the pain has improved. She states that this appeared around 350 this morning. The patient had taken 2 Advil and some Sudafed. At 5 AM she took 2 more Advil. She reports that she took a shower, place alcohol and vinegar in her left ear and then placed some warm olive oil into her left ear. She reports that nothing helped. The patient reports she had some sinus congestion earlier in the week and was seen at an urgent care clinic. She was told that it was a viral infection and that she had some fluid behind her left ear but nothing more. The patient reports that she was initially taking Mucinex but started taking some Sudafed today as the Mucinex is not helping. She was doing well today until this happened. The patient reports that her left ear feels stopped up. She has not had any fevers but she's had some mild dizziness and nausea. The patient is here for evaluation   Past Medical History:  Diagnosis Date  . Asthma   . Chicken pox   . Depression   . Hypertension   . Migraines     Patient Active Problem List   Diagnosis Date Noted  . Acute bronchitis 05/16/2016  . 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 Pain in lower jaw 08/29/2015  . ADHD (attention deficit hyperactivity disorder) 03/22/2015  . Migraines 03/22/2015  . Essential hypertension 03/22/2015  . Encounter to establish care 03/15/2015  . Multiple skin nodules 03/15/2015    Past Surgical  History:  Procedure Laterality Date  . APPENDECTOMY  1984  . TONSILLECTOMY AND ADENOIDECTOMY  1970    Prior to Admission medications   Medication Sig Start Date End Date Taking? Authorizing Provider  albuterol (PROVENTIL HFA) 108 (90 Base) MCG/ACT inhaler Inhale 2 puffs into the lungs every 6 (six) hours as needed for wheezing or shortness of breath. 05/16/16   Burnard Hawthorne, FNP  ALPRAZolam Duanne Moron) 0.25 MG tablet TK 1 T PO TID PRN 06/12/15   Historical Provider, MD  amoxicillin (AMOXIL) 875 MG tablet Take 1 tablet (875 mg total) by mouth 2 (two) times daily. 07/23/16   Loney Hering, MD  amphetamine-dextroamphetamine (ADDERALL XR) 15 MG 24 hr capsule Take 15 mg by mouth every morning. Reported on 09/09/2015    Historical Provider, MD  amphetamine-dextroamphetamine (ADDERALL XR) 30 MG 24 hr capsule Reported on 09/09/2015 08/25/15   Historical Provider, MD  escitalopram (LEXAPRO) 20 MG tablet Take 1 tablet by mouth daily. 03/09/15   Historical Provider, MD  fluticasone (FLONASE) 50 MCG/ACT nasal spray SHAKE LIQUID AND USE 2 SPRAYS IN EACH NOSTRIL DAILY 03/10/16   Coral Spikes, DO  hydrochlorothiazide (HYDRODIURIL) 12.5 MG tablet TAKE 1 TABLET(12.5 MG) BY MOUTH DAILY 05/16/16   Burnard Hawthorne, FNP  ofloxacin (FLOXIN) 0.3 % otic solution Place 5 drops into the left ear 2 (two) times daily. 07/23/16 08/02/16  Loney Hering, MD  promethazine-dextromethorphan (PROMETHAZINE-DM) 6.25-15 MG/5ML  syrup Take 5 mLs by mouth at bedtime as needed for cough. 05/16/16   Burnard Hawthorne, FNP  rizatriptan (MAXALT-MLT) 5 MG disintegrating tablet Take 1 tablet (5 mg total) by mouth as needed for migraine. May repeat in 2 hours if needed 03/15/15   Rubbie Battiest, NP  valACYclovir (VALTREX) 1000 MG tablet Take two tablets at first sign of fever blister then repeat in 12 hours 09/09/15   Rubbie Battiest, NP    Allergies Nsaids  Family History  Problem Relation Age of Onset  . Early death Mother     Brain  Tumor  . Cancer Maternal Grandmother     Uterus, Bladder, Lung and Colon    Social History Social History  Substance Use Topics  . Smoking status: Never Smoker  . Smokeless tobacco: Never Used  . Alcohol use 4.2 oz/week    7 Glasses of wine per week    Review of Systems Constitutional: No fever/chills Eyes: No visual changes. ENT: Left ear pain and bleeding, nasal congestion Cardiovascular: Denies chest pain. Respiratory: Denies shortness of breath. Gastrointestinal: No abdominal pain.  No nausea, no vomiting.  No diarrhea.  No constipation. Genitourinary: Negative for dysuria. Musculoskeletal: Negative for back pain. Skin: Negative for rash. Neurological: Negative for headaches, focal weakness or numbness.  10-point ROS otherwise negative.  ____________________________________________   PHYSICAL EXAM:  VITAL SIGNS: ED Triage Vitals  Enc Vitals Group     BP 07/23/16 0611 (!) 145/88     Pulse Rate 07/23/16 0611 79     Resp --      Temp 07/23/16 0611 98.8 F (37.1 C)     Temp Source 07/23/16 0611 Oral     SpO2 07/23/16 0611 100 %     Weight 07/23/16 0604 110 lb (49.9 kg)     Height 07/23/16 0604 5\' 2"  (1.575 m)     Head Circumference --      Peak Flow --      Pain Score 07/23/16 0604 2     Pain Loc --      Pain Edu? --      Excl. in [place]? --     Constitutional: Alert and oriented. Well appearing and in Mild distress. Ears: Right TM with no erythema and rupture. Right TM with erythema and blood in canal.  Eyes: Conjunctivae are normal. PERRL. EOMI. Head: Atraumatic. Nose: No congestion/rhinnorhea. Mouth/Throat: Mucous membranes are moist.  Oropharynx non-erythematous. Cardiovascular: Normal rate, regular rhythm. Grossly normal heart sounds.  Good peripheral circulation. Respiratory: Normal respiratory effort.  No retractions. Lungs CTAB. Gastrointestinal: Soft and nontender. No distention. Positive bowel sounds Musculoskeletal: No lower extremity tenderness  nor edema.  Neurologic:  Normal speech and language.  Skin:  Skin is warm, dry and intact.  Psychiatric: Mood and affect are normal.   ____________________________________________   LABS (all labs ordered are listed, but only abnormal results are displayed)  Labs Reviewed - No data to display ____________________________________________  EKG  none ____________________________________________  RADIOLOGY  none ____________________________________________   PROCEDURES  Procedure(s) performed: None  Procedures  Critical Care performed: No  ____________________________________________   INITIAL IMPRESSION / ASSESSMENT AND PLAN / ED COURSE  Pertinent labs & imaging results that were available during my care of the patient were reviewed by me and considered in my medical decision making (see chart for details).  This is a 50 year old female who comes into the hospital today with some left ear pain. The patient developed some bleeding from her left ear  when she arrived in the hospital. The concern is that the patient has a tympanic membrane rupture. I will give the patient a dose of amoxicillin. She reports her pain is much improved at this time. Since that is the case help place the patient on some oral antibiotics as well as some eardrops. The patient will need to follow-up with ENT. She has no further complaints or concerns at this time. She'll be discharged home.  Clinical Course      ____________________________________________   FINAL CLINICAL IMPRESSION(S) / ED DIAGNOSES  Final diagnoses:  Perforation of left tympanic membrane  Left ear pain      NEW MEDICATIONS STARTED DURING THIS VISIT:  New Prescriptions   AMOXICILLIN (AMOXIL) 875 MG TABLET    Take 1 tablet (875 mg total) by mouth 2 (two) times daily.   OFLOXACIN (FLOXIN) 0.3 % OTIC SOLUTION    Place 5 drops into the left ear 2 (two) times daily.     Note:  This document was prepared using Dragon  voice recognition software and may include unintentional dictation errors.    Loney Hering, MD 07/23/16 [phone]

## 2016-07-27 ENCOUNTER — Other Ambulatory Visit: Payer: Self-pay

## 2016-07-27 DIAGNOSIS — I10 Essential (primary) hypertension: Secondary | ICD-10-CM

## 2016-07-27 MED ORDER — HYDROCHLOROTHIAZIDE 12.5 MG PO TABS: ORAL_TABLET | ORAL | 0 refills | Status: DC

## 2016-07-27 NOTE — Telephone Encounter (Signed)
Medication has been filled 

## 2016-08-09 ENCOUNTER — Ambulatory Visit (INDEPENDENT_AMBULATORY_CARE_PROVIDER_SITE_OTHER): Payer: BC Managed Care – PPO | Admitting: Family

## 2016-08-09 ENCOUNTER — Encounter: Payer: Self-pay | Admitting: Family

## 2016-08-09 DIAGNOSIS — Z0000 Encounter for general adult medical examination without abnormal findings: Secondary | ICD-10-CM

## 2016-08-09 DIAGNOSIS — I10 Essential (primary) hypertension: Secondary | ICD-10-CM

## 2016-08-09 NOTE — Assessment & Plan Note (Signed)
Patient has scheduled colonoscopy as well as mammogram. She follows with [facility] and will have her Pap and pelvic exam done there in 2018. She also does her breast exam with [facility] and politely declines clinical breast exam today. No increased fracture risk. No history of smoking. Advised patient to have tetanus done at local pharmacy.

## 2016-08-09 NOTE — Assessment & Plan Note (Signed)
BP at goal in office. We discussed a trial -stop of adderall to see if contributing. Discussed lifestyle modifications, low salt, exercise to manage blood pressure versus increasing HCTZ at this time.

## 2016-08-09 NOTE — Patient Instructions (Signed)
Monitor BP; goal < 140/90  Trial stop adderall to see if affecting BP  As discussed, monitor salt and keep excercising- these are safer ways to bring BP down a few points  Health Maintenance, Female Introduction Adopting a healthy lifestyle and getting preventive care can go a long way to promote health and wellness. Talk with your health care provider about what schedule of regular examinations is right for you. This is a good chance for you to check in with your provider about disease prevention and staying healthy. In between checkups, there are plenty of things you can do on your own. Experts have done a lot of research about which lifestyle changes and preventive measures are most likely to keep you healthy. Ask your health care provider for more information. Weight and diet Eat a healthy diet  Be sure to include plenty of vegetables, fruits, low-fat dairy products, and lean protein.  Do not eat a lot of foods high in solid fats, added sugars, or salt.  Get regular exercise. This is one of the most important things you can do for your health.  Most adults should exercise for at least 150 minutes each week. The exercise should increase your heart rate and make you sweat (moderate-intensity exercise).  Most adults should also do strengthening exercises at least twice a week. This is in addition to the moderate-intensity exercise. Maintain a healthy weight  Body mass index (BMI) is a measurement that can be used to identify possible weight problems. It estimates body fat based on height and weight. Your health care provider can help determine your BMI and help you achieve or maintain a healthy weight.  For females 54 years of age and older:  A BMI below 18.5 is considered underweight.  A BMI of 18.5 to 24.9 is normal.  A BMI of 25 to 29.9 is considered overweight.  A BMI of 30 and above is considered obese. Watch levels of cholesterol and blood lipids  You should start having  your blood tested for lipids and cholesterol at 50 years of age, then have this test every 5 years.  You may need to have your cholesterol levels checked more often if:  Your lipid or cholesterol levels are high.  You are older than 50 years of age.  You are at high risk for heart disease. Cancer screening Lung Cancer  Lung cancer screening is recommended for adults 69-56 years old who are at high risk for lung cancer because of a history of smoking.  A yearly low-dose CT scan of the lungs is recommended for people who:  Currently smoke.  Have quit within the past 15 years.  Have at least a 30-pack-year history of smoking. A pack year is smoking an average of one pack of cigarettes a day for 1 year.  Yearly screening should continue until it has been 15 years since you quit.  Yearly screening should stop if you develop a health problem that would prevent you from having lung cancer treatment. Breast Cancer  Practice breast self-awareness. This means understanding how your breasts normally appear and feel.  It also means doing regular breast self-exams. Let your health care provider know about any changes, no matter how small.  If you are in your 20s or 30s, you should have a clinical breast exam (CBE) by a health care provider every 1-3 years as part of a regular health exam.  If you are 14 or older, have a CBE every year. Also consider having a  breast X-ray (mammogram) every year.  If you have a family history of breast cancer, talk to your health care provider about genetic screening.  If you are at high risk for breast cancer, talk to your health care provider about having an MRI and a mammogram every year.  Breast cancer gene (BRCA) assessment is recommended for women who have family members with BRCA-related cancers. BRCA-related cancers include:  Breast.  Ovarian.  Tubal.  Peritoneal cancers.  Results of the assessment will determine the need for genetic  counseling and BRCA1 and BRCA2 testing. Cervical Cancer  Your health care provider may recommend that you be screened regularly for cancer of the pelvic organs (ovaries, uterus, and vagina). This screening involves a pelvic examination, including checking for microscopic changes to the surface of your cervix (Pap test). You may be encouraged to have this screening done every 3 years, beginning at age 21.  For women ages 30-65, health care providers may recommend pelvic exams and Pap testing every 3 years, or they may recommend the Pap and pelvic exam, combined with testing for human papilloma virus (HPV), every 5 years. Some types of HPV increase your risk of cervical cancer. Testing for HPV may also be done on women of any age with unclear Pap test results.  Other health care providers may not recommend any screening for nonpregnant women who are considered low risk for pelvic cancer and who do not have symptoms. Ask your health care provider if a screening pelvic exam is right for you.  If you have had past treatment for cervical cancer or a condition that could lead to cancer, you need Pap tests and screening for cancer for at least 20 years after your treatment. If Pap tests have been discontinued, your risk factors (such as having a new sexual partner) need to be reassessed to determine if screening should resume. Some women have medical problems that increase the chance of getting cervical cancer. In these cases, your health care provider may recommend more frequent screening and Pap tests. Colorectal Cancer  This type of cancer can be detected and often prevented.  Routine colorectal cancer screening usually begins at 50 years of age and continues through 50 years of age.  Your health care provider may recommend screening at an earlier age if you have risk factors for colon cancer.  Your health care provider may also recommend using home test kits to check for hidden blood in the stool.  A  small camera at the end of a tube can be used to examine your colon directly (sigmoidoscopy or colonoscopy). This is done to check for the earliest forms of colorectal cancer.  Routine screening usually begins at age 50.  Direct examination of the colon should be repeated every 5-10 years through 50 years of age. However, you may need to be screened more often if early forms of precancerous polyps or small growths are found. Skin Cancer  Check your skin from head to toe regularly.  Tell your health care provider about any new moles or changes in moles, especially if there is a change in a mole's shape or color.  Also tell your health care provider if you have a mole that is larger than the size of a pencil eraser.  Always use sunscreen. Apply sunscreen liberally and repeatedly throughout the day.  Protect yourself by wearing long sleeves, pants, a wide-brimmed hat, and sunglasses whenever you are outside. Heart disease, diabetes, and high blood pressure  High blood pressure causes   heart disease and increases the risk of stroke. High blood pressure is more likely to develop in:  People who have blood pressure in the high end of the normal range (130-139/85-89 mm Hg).  People who are overweight or obese.  People who are African American.  If you are 18-39 years of age, have your blood pressure checked every 3-5 years. If you are 40 years of age or older, have your blood pressure checked every year. You should have your blood pressure measured twice-once when you are at a hospital or clinic, and once when you are not at a hospital or clinic. Record the average of the two measurements. To check your blood pressure when you are not at a hospital or clinic, you can use:  An automated blood pressure machine at a pharmacy.  A home blood pressure monitor.  If you are between 55 years and 79 years old, ask your health care provider if you should take aspirin to prevent strokes.  Have regular  diabetes screenings. This involves taking a blood sample to check your fasting blood sugar level.  If you are at a normal weight and have a low risk for diabetes, have this test once every three years after 50 years of age.  If you are overweight and have a high risk for diabetes, consider being tested at a younger age or more often. Preventing infection Hepatitis B  If you have a higher risk for hepatitis B, you should be screened for this virus. You are considered at high risk for hepatitis B if:  You were born in a country where hepatitis B is common. Ask your health care provider which countries are considered high risk.  Your parents were born in a high-risk country, and you have not been immunized against hepatitis B (hepatitis B vaccine).  You have HIV or AIDS.  You use needles to inject street drugs.  You live with someone who has hepatitis B.  You have had sex with someone who has hepatitis B.  You get hemodialysis treatment.  You take certain medicines for conditions, including cancer, organ transplantation, and autoimmune conditions. Hepatitis C  Blood testing is recommended for:  Everyone born from 1945 through 1965.  Anyone with known risk factors for hepatitis C. Sexually transmitted infections (STIs)  You should be screened for sexually transmitted infections (STIs) including gonorrhea and chlamydia if:  You are sexually active and are younger than 50 years of age.  You are older than 50 years of age and your health care provider tells you that you are at risk for this type of infection.  Your sexual activity has changed since you were last screened and you are at an increased risk for chlamydia or gonorrhea. Ask your health care provider if you are at risk.  If you do not have HIV, but are at risk, it may be recommended that you take a prescription medicine daily to prevent HIV infection. This is called pre-exposure prophylaxis (PrEP). You are considered at  risk if:  You are sexually active and do not regularly use condoms or know the HIV status of your partner(s).  You take drugs by injection.  You are sexually active with a partner who has HIV. Talk with your health care provider about whether you are at high risk of being infected with HIV. If you choose to begin PrEP, you should first be tested for HIV. You should then be tested every 3 months for as long as you are taking   PrEP. Pregnancy  If you are premenopausal and you may become pregnant, ask your health care provider about preconception counseling.  If you may become pregnant, take 400 to 800 micrograms (mcg) of folic acid every day.  If you want to prevent pregnancy, talk to your health care provider about birth control (contraception). Osteoporosis and menopause  Osteoporosis is a disease in which the bones lose minerals and strength with aging. This can result in serious bone fractures. Your risk for osteoporosis can be identified using a bone density scan.  If you are 63 years of age or older, or if you are at risk for osteoporosis and fractures, ask your health care provider if you should be screened.  Ask your health care provider whether you should take a calcium or vitamin D supplement to lower your risk for osteoporosis.  Menopause may have certain physical symptoms and risks.  Hormone replacement therapy may reduce some of these symptoms and risks. Talk to your health care provider about whether hormone replacement therapy is right for you. Follow these instructions at home:  Schedule regular health, dental, and eye exams.  Stay current with your immunizations.  Do not use any tobacco products including cigarettes, chewing tobacco, or electronic cigarettes.  If you are pregnant, do not drink alcohol.  If you are breastfeeding, limit how much and how often you drink alcohol.  Limit alcohol intake to no more than 1 drink per day for nonpregnant women. One drink  equals 12 ounces of beer, 5 ounces of wine, or 1 ounces of hard liquor.  Do not use street drugs.  Do not share needles.  Ask your health care provider for help if you need support or information about quitting drugs.  Tell your health care provider if you often feel depressed.  Tell your health care provider if you have ever been abused or do not feel safe at home. This information is not intended to replace advice given to you by your health care provider. Make sure you discuss any questions you have with your health care provider. Document Released: 02/13/2011 Document Revised: 01/06/2016 Document Reviewed: 05/04/2015  2017 [employer]vier

## 2016-08-09 NOTE — Progress Notes (Signed)
Pre visit review using our clinic review tool, if applicable. No additional management support is needed unless otherwise documented below in the visit note. 

## 2016-08-09 NOTE — Progress Notes (Signed)
Subjective:    Patient ID: Katherine Sharp, female    DOB: 05/21/1966, 50 y.o.   MRN: [id number]  CC: Katherine Sharp is a 50 y.o. female who presents today for physical exam.    HPI: Over all feeling well.  Concerned with blood pressure and feels like it's getting 'higher' lately.   Averages 145/95.   Concerned her adderall may be contributing. Occasionally knows BP feels high and feels 'flushed.' Denies exertional chest pain or pressure, numbness or tingling radiating to left arm or jaw, palpitations, dizziness, frequent headaches, changes in vision, or shortness of breath.        Colorectal Cancer Screening: Ordered.Scheduled. Breast Cancer Screening: Ordered; will schedule Cervical Cancer Screening: Follows with NP at [facility]. Bone Health screening/DEXA for 65+: No increased fracture risk. Defer screening at this time. Lung Cancer Screening: Doesn't have 30 year pack year history and age > 22 years. Immunizations       Tetanus - Due        Labs: Done prior Exercise: Gets regular exercise.  Alcohol use: Occasional Smoking/tobacco use: Nonsmoker.  Wears seat belt: Yes.  HISTORY:  Past Medical History:  Diagnosis Date  . Asthma   . Chicken pox   . Depression   . Hypertension   . Migraines     Past Surgical History:  Procedure Laterality Date  . APPENDECTOMY  1984  . TONSILLECTOMY AND ADENOIDECTOMY  1970   Family History  Problem Relation Age of Onset  . Early death Mother     Brain Tumor  . Cancer Maternal Grandmother     Uterus, Bladder, Lung and Colon      ALLERGIES: Nsaids  Current Outpatient Prescriptions on File Prior to Visit  Medication Sig Dispense Refill  . albuterol (PROVENTIL HFA) 108 (90 Base) MCG/ACT inhaler Inhale 2 puffs into the lungs every 6 (six) hours as needed for wheezing or shortness of breath. 1 Inhaler 1  . ALPRAZolam (XANAX) 0.25 MG tablet TK 1 T PO TID PRN  2  . amphetamine-dextroamphetamine (ADDERALL XR) 30 MG 24 hr  capsule Reported on 09/09/2015  0  . escitalopram (LEXAPRO) 20 MG tablet Take 1 tablet by mouth daily.    . fluticasone (FLONASE) 50 MCG/ACT nasal spray SHAKE LIQUID AND USE 2 SPRAYS IN EACH NOSTRIL DAILY 16 g 3  . hydrochlorothiazide (HYDRODIURIL) 12.5 MG tablet TAKE 1 TABLET(12.5 MG) BY MOUTH DAILY 90 tablet 0  . valACYclovir (VALTREX) 1000 MG tablet Take two tablets at first sign of fever blister then repeat in 12 hours 20 tablet 0   No current facility-administered medications on file prior to visit.     Social History  Substance Use Topics  . Smoking status: Never Smoker  . Smokeless tobacco: Never Used  . Alcohol use 4.2 oz/week    7 Glasses of wine per week    Review of Systems  Constitutional: Negative for chills, fever and unexpected weight change.  HENT: Negative for congestion.   Respiratory: Negative for cough.   Cardiovascular: Negative for chest pain, palpitations and leg swelling.  Gastrointestinal: Negative for nausea and vomiting.  Musculoskeletal: Negative for arthralgias and myalgias.  Skin: Negative for rash.  Neurological: Negative for headaches.  Hematological: Negative for adenopathy.  Psychiatric/Behavioral: Negative for confusion.      Objective:    BP 138/80   Pulse 83   Temp 98.1 F (36.7 C) (Oral)   Ht 5\' 2"  (1.575 m)   Wt 112 lb 3.2 oz (  50.9 kg)   LMP 07/20/2016 (Exact Date)   SpO2 98%   BMI 20.52 kg/m   BP Readings from Last 3 Encounters:  08/09/16 138/80  07/23/16 (!) 145/88  05/16/16 132/80   Wt Readings from Last 3 Encounters:  08/09/16 112 lb 3.2 oz (50.9 kg)  07/23/16 110 lb (49.9 kg)  05/16/16 110 lb 3.2 oz (50 kg)    Physical Exam  Constitutional: She appears well-developed and well-nourished.  Eyes: Conjunctivae are normal.  Neck: No thyroid mass and no thyromegaly present.  Cardiovascular: Normal rate, regular rhythm, normal heart sounds and normal pulses.   Pulmonary/Chest: Effort normal and breath sounds normal. She  has no wheezes. She has no rhonchi. She has no rales.  Lymphadenopathy:       Head (right side): No submental, no submandibular, no tonsillar, no preauricular, no posterior auricular and no occipital adenopathy present.       Head (left side): No submental, no submandibular, no tonsillar, no preauricular, no posterior auricular and no occipital adenopathy present.    She has no cervical adenopathy.  Neurological: She is alert.  Skin: Skin is warm and dry.  Psychiatric: She has a normal mood and affect. Her speech is normal and behavior is normal. Thought content normal.  Vitals reviewed.      Assessment & Plan:   Problem List Items Addressed This Visit      Cardiovascular and Mediastinum   Essential hypertension    BP at goal in office. We discussed a trial -stop of adderall to see if contributing. Discussed lifestyle modifications, low salt, exercise to manage blood pressure versus increasing HCTZ at this time.         Other   Encounter for annual physical exam    Patient has scheduled colonoscopy as well as mammogram. She follows with [facility] and will have her Pap and pelvic exam done there in 2018. She also does her breast exam with [facility] and politely declines clinical breast exam today. No increased fracture risk. No history of smoking. Advised patient to have tetanus done at local pharmacy.          I have discontinued Ms. Glazebrook's rizatriptan, promethazine-dextromethorphan, amoxicillin, and amoxicillin-clavulanate. I am also having her maintain her escitalopram, amphetamine-dextroamphetamine, ALPRAZolam, valACYclovir, fluticasone, albuterol, and hydrochlorothiazide.   Meds ordered this encounter  Medications  . DISCONTD: amoxicillin-clavulanate (AUGMENTIN) 875-125 MG tablet    Return precautions given.   Risks, benefits, and alternatives of the medications and treatment plan prescribed today were discussed, and patient expressed understanding.    Education regarding symptom management and diagnosis given to patient on AVS.   Continue to follow with Mable Paris, FNP for routine health maintenance.   [name] and I agreed with plan.   Mable Paris, FNP

## 2016-09-29 ENCOUNTER — Encounter: Payer: Self-pay | Admitting: *Deleted

## 2016-10-02 ENCOUNTER — Ambulatory Visit: Payer: BC Managed Care – PPO | Admitting: Certified Registered Nurse Anesthetist

## 2016-10-02 ENCOUNTER — Encounter: Payer: Self-pay | Admitting: *Deleted

## 2016-10-02 ENCOUNTER — Encounter
Admission: RE | Disposition: A | Discharge status: Home / Self Care | Payer: Self-pay | Source: Ambulatory Visit | Attending: Unknown Physician Specialty

## 2016-10-02 ENCOUNTER — Ambulatory Visit
Admission: RE | Admit: 2016-10-02 | Discharge: 2016-10-02 | Disposition: A | Discharge status: Home / Self Care | Payer: BC Managed Care – PPO | Source: Ambulatory Visit | Attending: Unknown Physician Specialty | Admitting: Unknown Physician Specialty

## 2016-10-02 DIAGNOSIS — Z79899 Other long term (current) drug therapy: Secondary | ICD-10-CM | POA: Insufficient documentation

## 2016-10-02 DIAGNOSIS — I10 Essential (primary) hypertension: Secondary | ICD-10-CM | POA: Diagnosis not present

## 2016-10-02 DIAGNOSIS — K621 Rectal polyp: Secondary | ICD-10-CM | POA: Insufficient documentation

## 2016-10-02 DIAGNOSIS — D123 Benign neoplasm of transverse colon: Secondary | ICD-10-CM | POA: Insufficient documentation

## 2016-10-02 DIAGNOSIS — J45909 Unspecified asthma, uncomplicated: Secondary | ICD-10-CM | POA: Insufficient documentation

## 2016-10-02 DIAGNOSIS — F329 Major depressive disorder, single episode, unspecified: Secondary | ICD-10-CM | POA: Insufficient documentation

## 2016-10-02 DIAGNOSIS — Z7951 Long term (current) use of inhaled steroids: Secondary | ICD-10-CM | POA: Insufficient documentation

## 2016-10-02 DIAGNOSIS — Z1211 Encounter for screening for malignant neoplasm of colon: Secondary | ICD-10-CM | POA: Insufficient documentation

## 2016-10-02 DIAGNOSIS — D125 Benign neoplasm of sigmoid colon: Secondary | ICD-10-CM | POA: Diagnosis not present

## 2016-10-02 DIAGNOSIS — K649 Unspecified hemorrhoids: Secondary | ICD-10-CM | POA: Diagnosis not present

## 2016-10-02 HISTORY — PX: COLONOSCOPY WITH PROPOFOL: SHX5780

## 2016-10-02 LAB — POCT PREGNANCY, URINE: Preg Test, Ur: NEGATIVE

## 2016-10-02 SURGERY — COLONOSCOPY WITH PROPOFOL
Anesthesia: General

## 2016-10-02 MED ORDER — PROPOFOL 10 MG/ML IV BOLUS
INTRAVENOUS | Status: AC
  Filled 2016-10-02: qty 20

## 2016-10-02 MED ORDER — ONDANSETRON HCL 4 MG/2ML IJ SOLN: 4.0000 mg | Freq: Once | INTRAMUSCULAR | Status: DC | PRN

## 2016-10-02 MED ORDER — SODIUM CHLORIDE 0.9 % IV SOLN
INTRAVENOUS | Status: DC
  Administered 2016-10-02: 09:00:00 via INTRAVENOUS

## 2016-10-02 MED ORDER — FENTANYL CITRATE (PF) 100 MCG/2ML IJ SOLN: 25.0000 ug | INTRAMUSCULAR | Status: DC | PRN

## 2016-10-02 MED ORDER — PROPOFOL 10 MG/ML IV BOLUS
INTRAVENOUS | Status: DC | PRN
  Administered 2016-10-02: 30 mg via INTRAVENOUS
  Administered 2016-10-02: 20 mg via INTRAVENOUS
  Administered 2016-10-02: 50 mg via INTRAVENOUS
  Administered 2016-10-02 (×2): 20 mg via INTRAVENOUS
  Administered 2016-10-02: 30 mg via INTRAVENOUS

## 2016-10-02 MED ORDER — PROPOFOL 500 MG/50ML IV EMUL
INTRAVENOUS | Status: DC | PRN
  Administered 2016-10-02: 125 ug/kg/min via INTRAVENOUS

## 2016-10-02 MED ORDER — PROPOFOL 500 MG/50ML IV EMUL
INTRAVENOUS | Status: AC
  Filled 2016-10-02: qty 50

## 2016-10-02 NOTE — Anesthesia Postprocedure Evaluation (Signed)
Anesthesia Post Note  Patient: Katherine Sharp  Procedure(s) Performed: Procedure(s) (LRB): COLONOSCOPY WITH PROPOFOL (N/A)  Patient location during evaluation: PACU Anesthesia Type: General Level of consciousness: awake and alert and oriented Pain management: pain level controlled Vital Signs Assessment: post-procedure vital signs reviewed and stable Respiratory status: spontaneous breathing Cardiovascular status: blood pressure returned to baseline Anesthetic complications: no     Last Vitals:  Vitals:   10/02/16 0951 10/02/16 1001  BP: 140/80 133/68  Pulse: 62 60  Resp: 20 (!) 21  Temp:      Last Pain:  Vitals:   10/02/16 0931  TempSrc: Tympanic                 Karinda Cabriales

## 2016-10-02 NOTE — H&P (Signed)
Primary Care Physician:  Mable Paris, FNP Primary Gastroenterologist:  Dr. Vira Agar  Pre-Procedure History & Physical: HPI:  Katherine Sharp is a 51 y.o. female is here for an colonoscopy.   Past Medical History:  Diagnosis Date  . Asthma   . Chicken pox   . Depression   . Hypertension   . Migraines     Past Surgical History:  Procedure Laterality Date  . APPENDECTOMY  1984  . TONSILLECTOMY    . TONSILLECTOMY AND ADENOIDECTOMY  1970    Prior to Admission medications   Medication Sig Start Date End Date Taking? Authorizing Provider  albuterol (PROVENTIL HFA) 108 (90 Base) MCG/ACT inhaler Inhale 2 puffs into the lungs every 6 (six) hours as needed for wheezing or shortness of breath. 05/16/16  Yes Burnard Hawthorne, FNP  ALPRAZolam Duanne Moron) 0.25 MG tablet TK 1 T PO TID PRN 06/12/15  Yes Historical Provider, MD  amphetamine-dextroamphetamine (ADDERALL XR) 30 MG 24 hr capsule Reported on 09/09/2015 08/25/15  Yes Historical Provider, MD  escitalopram (LEXAPRO) 20 MG tablet Take 1 tablet by mouth daily. 03/09/15  Yes Historical Provider, MD  fluticasone (FLONASE) 50 MCG/ACT nasal spray SHAKE LIQUID AND USE 2 SPRAYS IN EACH NOSTRIL DAILY 03/10/16  Yes Jayce G Cook, DO  hydrochlorothiazide (HYDRODIURIL) 12.5 MG tablet TAKE 1 TABLET(12.5 MG) BY MOUTH DAILY 07/27/16  Yes Burnard Hawthorne, FNP  valACYclovir (VALTREX) 1000 MG tablet Take two tablets at first sign of fever blister then repeat in 12 hours 09/09/15  Yes Rubbie Battiest, NP    Allergies as of 09/08/2016 - Review Complete 08/09/2016  Allergen Reaction Noted  . Nsaids Nausea Only 03/15/2015    Family History  Problem Relation Age of Onset  . Early death Mother     Brain Tumor  . Cancer Maternal Grandmother     Uterus, Bladder, Lung and Colon    Social History   Social History  . Marital status: Married    Spouse name: N/A  . Number of children: N/A  . Years of education: N/A   Occupational History  . Not on file.    Social History Main Topics  . Smoking status: Never Smoker  . Smokeless tobacco: Never Used  . Alcohol use 4.2 oz/week    7 Glasses of wine per week  . Drug use: No  . Sexual activity: Yes    Partners: Male    Birth control/ protection: Surgical     Comment: Vasectomy   Other Topics Concern  . 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    Pets: 3 cats inside and 1 dog inside        Review of Systems: See HPI, otherwise negative ROS  Physical Exam: BP 138/67   Pulse 64   Temp 97.1 F (36.2 C) (Tympanic)   Resp 14   Ht 5\' 2"  (1.575 m)   Wt 49.9 kg (110 lb)   SpO2 100%   BMI 20.12 kg/m  General:   Alert,  pleasant and cooperative in NAD Head:  Normocephalic and atraumatic. Neck:  Supple; no masses or thyromegaly. Lungs:  Clear throughout to auscultation.    Heart:  Regular rate and rhythm. Abdomen:  Soft, nontender and nondistended. Normal bowel sounds, without guarding, and without rebound.   Neurologic:  Alert and  oriented x4;  grossly normal neurologically.  Impression/Plan: Katherine Sharp is here for an colonoscopy to be performed for screening  Risks, benefits, limitations, and alternatives regarding  colonoscopy have been reviewed with the patient.  Questions have been answered.  All parties agreeable.   Gaylyn Cheers, MD  10/02/2016, 8:53 AM

## 2016-10-02 NOTE — Anesthesia Preprocedure Evaluation (Addendum)
Anesthesia Evaluation  Patient identified by MRN, date of birth, ID band Patient awake    Airway Mallampati: III  TM Distance: <3 FB     Dental no notable dental hx.    Pulmonary asthma ,    Pulmonary exam normal        Cardiovascular hypertension, Pt. on medications Normal cardiovascular exam     Neuro/Psych  Headaches, PSYCHIATRIC DISORDERS Depression    GI/Hepatic negative GI ROS, Neg liver ROS,   Endo/Other  negative endocrine ROS  Renal/GU negative Renal ROS  negative genitourinary   Musculoskeletal negative musculoskeletal ROS (+)   Abdominal Normal abdominal exam  (+)   Peds negative pediatric ROS (+)  Hematology negative hematology ROS (+)   Anesthesia Other Findings   Reproductive/Obstetrics                            Anesthesia Physical Anesthesia Plan  ASA: II  Anesthesia Plan: General   Post-op Pain Management:    Induction: Intravenous  Airway Management Planned: Nasal Cannula  Additional Equipment:   Intra-op Plan:   Post-operative Plan:   Informed Consent: I have reviewed the patients History and Physical, chart, labs and discussed the procedure including the risks, benefits and alternatives for the proposed anesthesia with the patient or authorized representative who has indicated his/her understanding and acceptance.   Dental advisory given  Plan Discussed with: CRNA and Surgeon  Anesthesia Plan Comments:         Anesthesia Quick Evaluation

## 2016-10-02 NOTE — Transfer of Care (Signed)
Immediate Anesthesia Transfer of Care Note  Patient: Katherine Sharp  Procedure(s) Performed: Procedure(s): COLONOSCOPY WITH PROPOFOL (N/A)  Patient Location: PACU  Anesthesia Type:General  Level of Consciousness: awake, alert  and oriented  Airway & Oxygen Therapy: Patient Spontanous Breathing and Patient connected to nasal cannula oxygen  Post-op Assessment: Report given to RN and Post -op Vital signs reviewed and stable  Post vital signs: Reviewed and stable  Last Vitals:  Vitals:   10/02/16 0832 10/02/16 0931  BP: 138/67 (!) 127/104  Pulse: 64 67  Resp: 14 16  Temp: 36.2 C 36.3 C    Last Pain:  Vitals:   10/02/16 0931  TempSrc: Tympanic         Complications: No apparent anesthesia complications

## 2016-10-02 NOTE — Anesthesia Post-op Follow-up Note (Cosign Needed)
Anesthesia QCDR form completed.        

## 2016-10-02 NOTE — Op Note (Signed)
[facility] Patient Name: Katherine Sharp Procedure Date: 10/02/2016 8:34 AM MRN: [id number] Account #: [id number] Date of Birth: June 28, 1966 Admit Type: Outpatient Age: 51 Room: [facility] ENDO ROOM 1 Gender: Female Note Status: Finalized Procedure:            Colonoscopy Indications:          Screening for colorectal malignant neoplasm Providers:            Manya Silvas, MD Referring MD:         Yvetta Coder. Arnett (Referring MD) Medicines:            Propofol per Anesthesia Complications:        No immediate complications. Procedure:            Pre-Anesthesia Assessment:                       - After reviewing the risks and benefits, the patient                        was deemed in satisfactory condition to undergo the                        procedure.                       After obtaining informed consent, the colonoscope was                        passed under direct vision. Throughout the procedure,                        the patient's blood pressure, pulse, and oxygen                        saturations were monitored continuously. The                        Colonoscope was introduced through the anus and                        advanced to the the cecum, identified by appendiceal                        orifice and ileocecal valve. The colonoscopy was                        performed without difficulty. The patient tolerated the                        procedure well. The quality of the bowel preparation                        was excellent. Findings:      A small polyp was found in the rectum. The polyp was sessile. The polyp       was removed with a cold snare. Resection and retrieval were complete.       Tiny remnants removed with small cold forceps.      Three sessile polyps were found in the recto-sigmoid colon. The polyps       were diminutive in size. These polyps were removed with a jumbo  cold       forceps. Resection and retrieval were  complete. Impression:           - One small polyp in the rectum, removed with a cold                        snare. Resected and retrieved.                       - Three diminutive polyps at the recto-sigmoid colon,                        removed with a jumbo cold forceps. Resected and                        retrieved. Recommendation:       - Await pathology results. Stool softener pills so as                        to not have hard stool and irritate the polyp removal                        site. Manya Silvas, MD 10/02/2016 9:29:29 AM This report has been signed electronically. Number of Addenda: 0 Note Initiated On: 10/02/2016 8:34 AM Scope Withdrawal Time: 0 hours 18 minutes 44 seconds  Total Procedure Duration: 0 hours 24 minutes 53 seconds       [facility]

## 2016-10-03 ENCOUNTER — Encounter: Payer: Self-pay | Admitting: Unknown Physician Specialty

## 2016-10-03 LAB — SURGICAL PATHOLOGY

## 2016-10-23 ENCOUNTER — Other Ambulatory Visit: Payer: Self-pay | Admitting: Family

## 2016-10-23 DIAGNOSIS — I10 Essential (primary) hypertension: Secondary | ICD-10-CM

## 2016-11-13 ENCOUNTER — Other Ambulatory Visit: Payer: Self-pay | Admitting: Nurse Practitioner

## 2016-11-13 ENCOUNTER — Other Ambulatory Visit: Payer: Self-pay | Admitting: Family Medicine

## 2016-11-13 ENCOUNTER — Other Ambulatory Visit: Payer: Self-pay | Admitting: Family

## 2016-11-13 DIAGNOSIS — I10 Essential (primary) hypertension: Secondary | ICD-10-CM

## 2016-11-13 NOTE — Telephone Encounter (Signed)
Would you call pt and have her come by for a quick bmp  Otherwise I have refilled her HCTZ however been a while since we check electrolytes  Advise her to ensure we check these every 3-4 months.

## 2017-03-12 ENCOUNTER — Ambulatory Visit: Payer: BC Managed Care – PPO | Admitting: Family

## 2017-03-16 ENCOUNTER — Other Ambulatory Visit: Payer: Self-pay | Admitting: Family Medicine

## 2017-05-06 ENCOUNTER — Other Ambulatory Visit: Payer: Self-pay | Admitting: Family

## 2017-05-24 ENCOUNTER — Other Ambulatory Visit: Payer: Self-pay | Admitting: Family Medicine

## 2017-06-26 ENCOUNTER — Other Ambulatory Visit: Payer: Self-pay | Admitting: Family

## 2017-08-07 ENCOUNTER — Other Ambulatory Visit: Payer: Self-pay | Admitting: Family

## 2017-08-11 ENCOUNTER — Other Ambulatory Visit: Payer: Self-pay | Admitting: Family

## 2017-09-30 ENCOUNTER — Other Ambulatory Visit: Payer: Self-pay | Admitting: Family

## 2017-10-01 NOTE — Telephone Encounter (Signed)
lov 07/30/16 Nov sched Last filled 08/30/17

## 2017-10-05 ENCOUNTER — Other Ambulatory Visit: Payer: Self-pay | Admitting: Family

## 2017-10-05 NOTE — Telephone Encounter (Signed)
Last office visit 07/30/16 No office visit scheduled

## 2017-10-15 ENCOUNTER — Telehealth: Payer: Self-pay | Admitting: *Deleted

## 2017-10-15 NOTE — Telephone Encounter (Signed)
The patient has been scheduled for 3.6.19 @ 2:00. She is aware of this appointment.

## 2017-10-15 NOTE — Telephone Encounter (Signed)
Copied from [place] [phone]. Topic: Appointment Scheduling - Scheduling Inquiry for Clinic >> Oct 15, 2017  1:03 PM Synthia Innocent wrote: Reason for CRM: Patient is requesting to be seen this week or next week by Mable Paris for left elbow pain and follow up on BP. Please advise.

## 2017-10-16 ENCOUNTER — Telehealth: Payer: Self-pay | Admitting: Family

## 2017-10-16 NOTE — Telephone Encounter (Signed)
Please mail letter-   Hope you are well.   In reviewing your chart, it appears your are due for annual mammogram.  Please let us know if you would like for me to order. You may call the office to let us know, and we will order for you.   Once the order in the system, you may schedule at your preferred location.   Typically women have been using one of the sites below however you may go where you have been in the past. I would ensure that when you do get a mammogram that it is 3D ( as opposed to 2D which was prior technology). Evidence suggests that 3D is superior.   Please note that NOT all insurance companies cover 3D, and you may have to pay a higher copay. You may call your insurance company to further clarify your benefits.   Options for Mammogram in [place]:    [facility]  [address]  [place], [place]  [phone]   [facility] [address] , [place] [phone]   Let us know if you have questions.   My best,   Marzelle Rutten, NP   

## 2017-10-17 ENCOUNTER — Encounter: Payer: Self-pay | Admitting: Family

## 2017-10-17 ENCOUNTER — Encounter (INDEPENDENT_AMBULATORY_CARE_PROVIDER_SITE_OTHER): Payer: Self-pay

## 2017-10-17 ENCOUNTER — Ambulatory Visit: Payer: BC Managed Care – PPO | Admitting: Family

## 2017-10-17 VITALS — BP 160/100 | HR 84 | Temp 98.1°F | Wt 112.1 lb

## 2017-10-17 DIAGNOSIS — M25522 Pain in left elbow: Secondary | ICD-10-CM | POA: Diagnosis not present

## 2017-10-17 DIAGNOSIS — I10 Essential (primary) hypertension: Secondary | ICD-10-CM

## 2017-10-17 DIAGNOSIS — L209 Atopic dermatitis, unspecified: Secondary | ICD-10-CM | POA: Diagnosis not present

## 2017-10-17 MED ORDER — DICLOFENAC SODIUM 1 % TD GEL: 4.0000 g | Freq: Four times a day (QID) | TRANSDERMAL | 3 refills | Status: DC

## 2017-10-17 MED ORDER — LISINOPRIL 5 MG PO TABS
5.0000 mg | ORAL_TABLET | Freq: Every day | ORAL | 3 refills | Status: DC
Start: 2017-10-17 — End: 2018-10-10

## 2017-10-17 MED ORDER — TRIAMCINOLONE ACETONIDE 0.025 % EX CREA: 1.0000 "application " | TOPICAL_CREAM | Freq: Two times a day (BID) | CUTANEOUS | 1 refills | Status: DC

## 2017-10-17 NOTE — Assessment & Plan Note (Signed)
Reassured by neurologic exam and improvement of BP while rested in exam room. Long discussion with patient about how her clinical picture of elevated blood pressure doesn't make sense to me. It may be essential hypertension; however with her other complaints today - HA, tinnitus, labile BP- I want to ensure more thorough work up.  ? Resistant HTN  Pending MRI Brain, referral to neurology ( ?pseudotumor cerebri), US renal, and labs. In the interim, we will start lisinopril.  Close follow up.

## 2017-10-17 NOTE — Patient Instructions (Addendum)
pseudotumor cerebri- please read about it BUT NOT too much as I discussed. Your headache, blood pressure, and ringing in ears can be soft signs of this.   Renal artery ultrasound.  Start lisinopril.  Monitor blood pressure,  Goal is less than 130/80; if persistently higher, please make sooner follow up appointment so we can recheck you blood pressure and manage medications   Trial voltaren gel.   May use some kenalog with eucerin.   Labs in 5 days - want them 5 days after starting lisinopril. Please make an appointment.

## 2017-10-17 NOTE — Assessment & Plan Note (Signed)
Working diagnosis of overuse syndrome. No injury.Differentials include Lateral epicondylitis v biceps tendon injury. We agreed on conservative management at this time. Patient will not repetitive movements and try to refrain from them. Trial of voltaren, heat. If no improvement, patient will let me know and we will purse xrays and orthopedic consult.

## 2017-10-17 NOTE — Progress Notes (Signed)
Subjective:    Patient ID: Katherine Sharp, female    DOB: [birth year], 52 y.o.   MRN: [id number]  CC: Katherine Sharp is a 52 y.o. female who presents today for follow up.   HPI: Left lateral elbow pain x 3-4 months. Pain when picking up from supination. No injury. 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 Sleeps with arm bent. Right handed.   H/o ganglion cyst  HTN - at home, 161/99, 171/106, 158/96, 2 weeks ago. This past week 126/73, 133/85.  Still has tinnitus.  Denies exertional chest pain or pressure, numbness or tingling radiating to left arm or jaw, palpitations, dizziness, frequent headaches, changes in vision, or shortness of breath.    ADHD- on 30mg  XR and then 20mg  'booster dose'. Started short acting couple of months with Dr Toy Care- she doesn't take on weekends. Went off the adderall for a while with a prior pcp and didn't note difference.   Depression and anxiety- on lexapro, xanax. Follows with Dr Toy Care   H/o HA Migraine- has never had to use maxalt until 2 weeks ago when had HA and took Maxalt. At that time it was during menstrul cycle. Notes a dull HA since then. Describes right sided 'migraine'. Last migraine was 15 years ago. Notes migraines started in highschool.   Notes feeling that finding words is more difficult, names of students in class. Similar symptoms when at old job and feeling depressed. Lexapro helped.   Would  Like kenalog for h/o eczema    HISTORY:  Past Medical History:  Diagnosis Date  . Asthma   . Chicken pox   . Depression   . Hypertension   . Migraines    Past Surgical History:  Procedure Laterality Date  . APPENDECTOMY  1984  . COLONOSCOPY WITH PROPOFOL N/A 10/02/2016   Procedure: COLONOSCOPY WITH PROPOFOL;  Surgeon: Manya Silvas, MD;  Location: [facility] ENDOSCOPY;  Service: Endoscopy;  Laterality: N/A;  . TONSILLECTOMY    . TONSILLECTOMY AND ADENOIDECTOMY  1970   Family History  Problem Relation Age of Onset  .  Early death Mother        Brain Tumor  . Cancer Maternal Grandmother        Uterus, Bladder, Lung and Colon    Allergies: Nsaids Current Outpatient Medications on File Prior to Visit  Medication Sig Dispense Refill  . albuterol (PROVENTIL HFA) 108 (90 Base) MCG/ACT inhaler Inhale 2 puffs into the lungs every 6 (six) hours as needed for wheezing or shortness of breath. 1 Inhaler 1  . ALPRAZolam (XANAX) 0.25 MG tablet TK 1 T PO TID PRN  2  . amphetamine-dextroamphetamine (ADDERALL XR) 30 MG 24 hr capsule Reported on 09/09/2015  0  . amphetamine-dextroamphetamine (ADDERALL) 20 MG tablet TK 1 T PO QD  0  . escitalopram (LEXAPRO) 20 MG tablet Take 1 tablet by mouth daily.    . fluticasone (FLONASE) 50 MCG/ACT nasal spray SHAKE LIQUID AND USE 2 SPRAYS IN EACH NOSTRIL DAILY 16 g 6  . hydrochlorothiazide (HYDRODIURIL) 12.5 MG tablet TAKE 1 TABLET BY MOUTH DAILY 90 tablet 0  . rizatriptan (MAXALT-MLT) 5 MG disintegrating tablet Take 5 mg by mouth as needed for migraine. May repeat in 2 hours if needed    . valACYclovir (VALTREX) 1000 MG tablet TAKE 2 TABLETS BY MOUTH AT FIRST SIGN OF FEVER BLISTER THEN REPEAT IN 12 HOURS 20 tablet 0   No current facility-administered medications on file prior to  visit.     Social History   Tobacco Use  . Smoking status: Never Smoker  . Smokeless tobacco: Never Used  Substance Use Topics  . Alcohol use: Yes    Alcohol/week: 4.2 oz    Types: 7 Glasses of wine per week  . Drug use: No    Review of Systems  Constitutional: Negative for chills, fever and unexpected weight change.  HENT: Negative for congestion.   Eyes: Negative for visual disturbance.  Respiratory: Negative for cough.   Cardiovascular: Negative for chest pain, palpitations and leg swelling.  Gastrointestinal: Negative for nausea and vomiting.  Musculoskeletal: Positive for arthralgias. Negative for joint swelling and myalgias.  Skin: Negative for rash.  Neurological: Positive for  headaches. Negative for dizziness and numbness.  Hematological: Negative for adenopathy.  Psychiatric/Behavioral: Negative for confusion.      Objective:    BP (!) 160/100   Pulse 84   Temp 98.1 F (36.7 C) (Oral)   Wt 112 lb 2 oz (50.9 kg)   SpO2 98%   BMI 20.51 kg/m  BP Readings from Last 3 Encounters:  10/17/17 (!) 160/100  10/02/16 133/68  08/09/16 138/80   Wt Readings from Last 3 Encounters:  10/17/17 112 lb 2 oz (50.9 kg)  10/02/16 110 lb (49.9 kg)  08/09/16 112 lb 3.2 oz (50.9 kg)    Physical Exam  Constitutional: She appears well-developed and well-nourished.  HENT:  Mouth/Throat: Uvula is midline, oropharynx is clear and moist and mucous membranes are normal.  Eyes: Conjunctivae and EOM are normal. Pupils are equal, round, and reactive to light.  Fundus normal bilaterally.   Cardiovascular: Normal rate, regular rhythm, normal heart sounds and normal pulses.  Pulmonary/Chest: Effort normal and breath sounds normal. She has no wheezes. She has no rhonchi. She has no rales.  Musculoskeletal:       Left elbow: She exhibits normal range of motion and no swelling. Tenderness found. Lateral epicondyle tenderness noted.  Full ROM with flexion, extension, supination, and pronation. Pain with supination.  Strength 5/5. Mildly positive Yergason's test.  No ecchymosis or swelling.   Neurological: She is alert. She has normal strength. No cranial nerve deficit or sensory deficit. She displays a negative Romberg sign.  Reflex Scores:      Bicep reflexes are 2+ on the right side and 2+ on the left side.      Patellar reflexes are 2+ on the right side and 2+ on the left side. Grip equal and strong bilateral upper extremities. Gait strong and steady. Able to perform rapid alternating movement without difficulty.   Skin: Skin is warm and dry.  Psychiatric: She has a normal mood and affect. Her speech is normal and behavior is normal. Thought content normal.  Vitals  reviewed.      Assessment & Plan:   Problem List Items Addressed This Visit      Cardiovascular and Mediastinum   Essential hypertension - Primary    Reassured by neurologic exam and improvement of BP while rested in exam room. Long discussion with patient about how her clinical picture of elevated blood pressure doesn't make sense to me. It may be essential hypertension; however with her other complaints today - HA, tinnitus, labile BP- I want to ensure more thorough work up.  ? Resistant HTN  Pending MRI Brain, referral to neurology ( ?pseudotumor cerebri), US renal, and labs. In the interim, we will start lisinopril.  Close follow up.       Relevant Medications  lisinopril (PRINIVIL,ZESTRIL) 5 MG tablet   Other Relevant Orders   Ambulatory referral to Neurology   US RENAL ARTERY DUPLEX COMPLETE   Aldosterone + renin activity w/ ratio   TSH   Comprehensive metabolic panel     Musculoskeletal and Integument   Atopic dermatitis    Given kenalog. Advised to use with eucerin or an emollient. Cautioned with frequent use and skin discoloration.       Relevant Medications   triamcinolone (KENALOG) 0.025 % cream     Other   Left elbow pain    Working diagnosis of overuse syndrome. No injury.Differentials include Lateral epicondylitis v biceps tendon injury. We agreed on conservative management at this time. Patient will not repetitive movements and try to refrain from them. Trial of voltaren, heat. If no improvement, patient will let me know and we will purse xrays and orthopedic consult.       Relevant Medications   diclofenac sodium (VOLTAREN) 1 % GEL       I am having Katherine Sharp start on diclofenac sodium, lisinopril, and triamcinolone. I am also having her maintain her escitalopram, amphetamine-dextroamphetamine, ALPRAZolam, albuterol, hydrochlorothiazide, fluticasone, valACYclovir, amphetamine-dextroamphetamine, and rizatriptan.   Meds ordered this encounter   Medications  . diclofenac sodium (VOLTAREN) 1 % GEL    Sig: Apply 4 g topically 4 (four) times daily.    Dispense:  1 Tube    Refill:  3    Order Specific Question:   Supervising Provider    Answer:   Derrel Nip, TERESA L [[id number]]  . lisinopril (PRINIVIL,ZESTRIL) 5 MG tablet    Sig: Take 1 tablet (5 mg total) by mouth daily.    Dispense:  90 tablet    Refill:  3    Order Specific Question:   Supervising Provider    Answer:   Deborra Medina L [[id number]]  . triamcinolone (KENALOG) 0.025 % cream    Sig: Apply 1 application topically 2 (two) times daily.    Dispense:  80 g    Refill:  1    Order Specific Question:   Supervising Provider    Answer:   Crecencio Mc [[id number]]    Return precautions given.   Risks, benefits, and alternatives of the medications and treatment plan prescribed today were discussed, and patient expressed understanding.   Education regarding symptom management and diagnosis given to patient on AVS.  Continue to follow with Burnard Hawthorne, FNP for routine health maintenance.   [name] and I agreed with plan.   Mable Paris, FNP

## 2017-10-17 NOTE — Assessment & Plan Note (Signed)
Given kenalog. Advised to use with eucerin or an emollient. Cautioned with frequent use and skin discoloration.

## 2017-10-26 ENCOUNTER — Encounter: Payer: Self-pay | Admitting: *Deleted

## 2017-10-26 NOTE — Telephone Encounter (Signed)
Mailed letter °

## 2017-11-03 ENCOUNTER — Other Ambulatory Visit: Payer: Self-pay | Admitting: Family

## 2017-12-17 ENCOUNTER — Ambulatory Visit: Payer: BC Managed Care – PPO | Admitting: Family

## 2017-12-17 ENCOUNTER — Encounter: Payer: Self-pay | Admitting: Family

## 2017-12-17 VITALS — BP 118/74 | HR 90 | Temp 98.6°F | Resp 16 | Wt 108.8 lb

## 2017-12-17 DIAGNOSIS — L989 Disorder of the skin and subcutaneous tissue, unspecified: Secondary | ICD-10-CM

## 2017-12-17 DIAGNOSIS — H9313 Tinnitus, bilateral: Secondary | ICD-10-CM

## 2017-12-17 DIAGNOSIS — I10 Essential (primary) hypertension: Secondary | ICD-10-CM | POA: Diagnosis not present

## 2017-12-17 NOTE — Patient Instructions (Signed)
Pleasure seeing you  Please stay vigilant for any infection from drained cyst today. Keep covered. Call with any concerns  Today we discussed referrals, orders. ENT for smell, ringing in ears   I have placed these orders in the system for you.  Please be sure to give [place] a call if you have not heard from our office regarding scheduling a test or regarding referral in a timely manner.  It is very important that you let me know as soon as possible.

## 2017-12-17 NOTE — Progress Notes (Signed)
Subjective:    Patient ID: Katherine Sharp, female    DOB: 28-Feb-1966, 52 y.o.   MRN: [id number]  CC: Katherine Sharp is a 52 y.o. female who presents today for follow up.   HPI: HTN- . Started lisinopril and blood has improved. Denies exertional chest pain or pressure, numbness or tingling radiating to left arm or jaw, palpitations, dizziness, changes in vision, or shortness of breath.    Complains of perfume malodorous smell which she smells and on one else appreciates it. Going for one year. Notices it more often now.  Uses flonase regularly.  Tastes food normally.  Tinnitus ( bilateral) is unchanged. 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 No ear pain.  She also describes a right 'ganglion cyst' in middle finger, for a year, larger in size.  Nontender however the look of it bothers her.  No purulent discharge, erythema.  In the past it resolved when a physician put a needle in it to drain it.  This was several months ago.  She is requesting to do this today.  Declines surgery consult.   Has been to the ophthalmology- had new glasses, contacts which has helped with Headaches.  HA's are only occasional now.         HISTORY:  Past Medical History:  Diagnosis Date  . Asthma   . Chicken pox   . Depression   . Hypertension   . Migraines    Past Surgical History:  Procedure Laterality Date  . APPENDECTOMY  1984  . COLONOSCOPY WITH PROPOFOL N/A 10/02/2016   Procedure: COLONOSCOPY WITH PROPOFOL;  Surgeon: Manya Silvas, MD;  Location: [facility] ENDOSCOPY;  Service: Endoscopy;  Laterality: N/A;  . TONSILLECTOMY    . TONSILLECTOMY AND ADENOIDECTOMY  1970   Family History  Problem Relation Age of Onset  . Early death Mother        Brain Tumor  . Cancer Maternal Grandmother        Uterus, Bladder, Lung and Colon    Allergies: Nsaids Current Outpatient Medications on File Prior to Visit  Medication Sig Dispense Refill  . albuterol (PROVENTIL HFA) 108 (90 Base) MCG/ACT inhaler  Inhale 2 puffs into the lungs every 6 (six) hours as needed for wheezing or shortness of breath. 1 Inhaler 1  . ALPRAZolam (XANAX) 0.25 MG tablet TK 1 T PO TID PRN  2  . amphetamine-dextroamphetamine (ADDERALL XR) 30 MG 24 hr capsule Reported on 09/09/2015  0  . amphetamine-dextroamphetamine (ADDERALL) 20 MG tablet TK 1 T PO QD  0  . escitalopram (LEXAPRO) 20 MG tablet Take 1 tablet by mouth daily.    . fluticasone (FLONASE) 50 MCG/ACT nasal spray SHAKE LIQUID AND USE 2 SPRAYS IN EACH NOSTRIL DAILY 16 g 6  . hydrochlorothiazide (HYDRODIURIL) 12.5 MG tablet TAKE 1 TABLET BY MOUTH DAILY 90 tablet 1  . lisinopril (PRINIVIL,ZESTRIL) 5 MG tablet Take 1 tablet (5 mg total) by mouth daily. 90 tablet 3  . rizatriptan (MAXALT-MLT) 5 MG disintegrating tablet Take 5 mg by mouth as needed for migraine. May repeat in 2 hours if needed    . triamcinolone (KENALOG) 0.025 % cream Apply 1 application topically 2 (two) times daily. 80 g 1  . valACYclovir (VALTREX) 1000 MG tablet TAKE 2 TABLETS BY MOUTH AT FIRST SIGN OF FEVER BLISTER THEN REPEAT IN 12 HOURS 20 tablet 0   No current facility-administered medications on file prior to visit.     Social History   Tobacco  Use  . Smoking status: Never Smoker  . Smokeless tobacco: Never Used  Substance Use Topics  . Alcohol use: Yes    Alcohol/week: 4.2 oz    Types: 7 Glasses of wine per week  . Drug use: No    Review of Systems  Constitutional: Negative for chills and fever.  HENT: Negative for congestion, nosebleeds, postnasal drip and rhinorrhea.   Respiratory: Negative for cough.   Cardiovascular: Negative for chest pain and palpitations.  Gastrointestinal: Negative for nausea and vomiting.  Skin: Negative for wound.  Neurological: Positive for headaches (improved).      Objective:    BP 118/74 (BP Location: Left Arm, Patient Position: Sitting, Cuff Size: Normal)   Pulse 90   Temp 98.6 F (37 C) (Oral)   Resp 16   Wt 108 lb 12 oz (49.3 kg)    SpO2 98%   BMI 19.89 kg/m  BP Readings from Last 3 Encounters:  12/17/17 118/74  10/17/17 (!) 160/100  10/02/16 133/68   Wt Readings from Last 3 Encounters:  12/17/17 108 lb 12 oz (49.3 kg)  10/17/17 112 lb 2 oz (50.9 kg)  10/02/16 110 lb (49.9 kg)    Physical Exam  Constitutional: She appears well-developed and well-nourished.  Eyes: Conjunctivae are normal.  Cardiovascular: Normal rate, regular rhythm, normal heart sounds and normal pulses.  Pulmonary/Chest: Effort normal and breath sounds normal. She has no wheezes. She has no rhonchi. She has no rales.  Musculoskeletal:       Hands: Firm, approximately 2-3 similar nodule noted right middle finger proximal to DIP joint.  Nontender.  No erythema, increased warmth.  No fluctuance, purulent discharge noted. Patient consented to having nodule drained today.  Using a #11 scalpel, area was prepped with Betadine solution, a small pinpoint hole was made in the skin and clear gel-like drainage was gently expressed from nodule.  Patient tolerated procedure well.  Bleeding was controlled with pressure.  Band-Aid placed on small incision.  Neurological: She is alert.  Skin: Skin is warm and dry.  Psychiatric: She has a normal mood and affect. Her speech is normal and behavior is normal. Thought content normal.  Vitals reviewed.      Assessment & Plan:   Problem List Items Addressed This Visit      Cardiovascular and Mediastinum   Essential hypertension    Improved.  Reassured this patient's headaches have largely resolved after changing her eyeglasses.  At this time, she politely declines renal ultrasound, MRI, consult with neurology.  I think this is reasonable as long as her headaches are controlled and blood pressures controlled.  We will continue to closely monitor.  Pending consult with ENT as described in this note.         Musculoskeletal and Integument   Skin lesion    Symptom consistent with a ganglion cyst.  Patient very  much preferred to me to drain it today.  I advised her that I was uncomfortable with this however I did agree to make a very small incision, and if it were to easily drain, I would be satisfied with that.   After small incision, the fluid easily drained and the cyst was largely decompressed.  No clinical symptoms symptoms to support infection.   I offered patient a referral to plastic surgery for further evaluation, possibly surgery and she politely declined at this time.  asked her to be vigilant for any signs of infection since I had made an incision.    Patient  verbalized understanding.        Other   Tinnitus of both ears - Primary    Chronic.  In the context of also changes to sense of smell ( of which etiology is nonspecific at this time.)  Patient and I jointly agreed to consult with ENT appropriate.  She has seen Dr Tami Ribas in th past . I did advise in the interim that she uses Flonase every other day, and may substitute using normal saline spray in between as question if Flonase contributory. Will follow.      Relevant Orders   Ambulatory referral to ENT       I have discontinued Aileen Pilot. Hochstetler's diclofenac sodium. I am also having her maintain her escitalopram, amphetamine-dextroamphetamine, ALPRAZolam, albuterol, fluticasone, valACYclovir, amphetamine-dextroamphetamine, rizatriptan, lisinopril, triamcinolone, and hydrochlorothiazide.   No orders of the defined types were placed in this encounter.   Return precautions given.   Risks, benefits, and alternatives of the medications and treatment plan prescribed today were discussed, and patient expressed understanding.   Education regarding symptom management and diagnosis given to patient on AVS.  Continue to follow with Burnard Hawthorne, FNP for routine health maintenance.   [name] and I agreed with plan.   Mable Paris, FNP

## 2017-12-19 DIAGNOSIS — L989 Disorder of the skin and subcutaneous tissue, unspecified: Secondary | ICD-10-CM | POA: Insufficient documentation

## 2017-12-19 DIAGNOSIS — H9313 Tinnitus, bilateral: Secondary | ICD-10-CM | POA: Insufficient documentation

## 2017-12-19 NOTE — Assessment & Plan Note (Signed)
Improved.  Reassured this patient's headaches have largely resolved after changing her eyeglasses.  At this time, she politely declines renal ultrasound, MRI, consult with neurology.  I think this is reasonable as long as her headaches are controlled and blood pressures controlled.  We will continue to closely monitor.  Pending consult with ENT as described in this note.

## 2017-12-19 NOTE — Assessment & Plan Note (Signed)
Symptom consistent with a ganglion cyst.  Patient very much preferred to me to drain it today.  I advised her that I was uncomfortable with this however I did agree to make a very small incision, and if it were to easily drain, I would be satisfied with that.   After small incision, the fluid easily drained and the cyst was largely decompressed.  No clinical symptoms symptoms to support infection.   I offered patient a referral to plastic surgery for further evaluation, possibly surgery and she politely declined at this time.  asked her to be vigilant for any signs of infection since I had made an incision.    Patient verbalized understanding.

## 2017-12-19 NOTE — Assessment & Plan Note (Signed)
Chronic.  In the context of also changes to sense of smell ( of which etiology is nonspecific at this time.)  Patient and I jointly agreed to consult with ENT appropriate.  She has seen Dr Tami Ribas in th past . I did advise in the interim that she uses Flonase every other day, and may substitute using normal saline spray in between as question if Flonase contributory. Will follow.

## 2018-05-03 ENCOUNTER — Other Ambulatory Visit: Payer: Self-pay | Admitting: Family

## 2018-05-10 ENCOUNTER — Encounter: Payer: Self-pay | Admitting: Family

## 2018-05-13 ENCOUNTER — Other Ambulatory Visit: Payer: Self-pay | Admitting: Family

## 2018-05-13 DIAGNOSIS — M25522 Pain in left elbow: Secondary | ICD-10-CM

## 2018-05-14 NOTE — Telephone Encounter (Signed)
FYI

## 2018-05-15 NOTE — Progress Notes (Unsigned)
It has been sent to [place]. Thanks [employer]

## 2018-07-31 ENCOUNTER — Other Ambulatory Visit: Payer: Self-pay | Admitting: Family

## 2018-10-10 ENCOUNTER — Other Ambulatory Visit: Payer: Self-pay | Admitting: Family

## 2018-10-10 DIAGNOSIS — I10 Essential (primary) hypertension: Secondary | ICD-10-CM

## 2018-10-31 ENCOUNTER — Other Ambulatory Visit: Payer: Self-pay | Admitting: Family

## 2018-11-19 ENCOUNTER — Other Ambulatory Visit: Payer: Self-pay | Admitting: Family

## 2018-11-20 MED ORDER — VALACYCLOVIR HCL 1 G PO TABS: 500.0000 mg | ORAL_TABLET | Freq: Every day | ORAL | 0 refills | Status: DC

## 2018-12-03 ENCOUNTER — Other Ambulatory Visit: Payer: Self-pay | Admitting: Family

## 2018-12-03 MED ORDER — VALACYCLOVIR HCL 1 G PO TABS: 500.0000 mg | ORAL_TABLET | Freq: Every day | ORAL | 0 refills | Status: DC

## 2019-01-07 ENCOUNTER — Other Ambulatory Visit: Payer: Self-pay | Admitting: Family

## 2019-01-07 DIAGNOSIS — I10 Essential (primary) hypertension: Secondary | ICD-10-CM

## 2019-04-06 ENCOUNTER — Other Ambulatory Visit: Payer: Self-pay | Admitting: Family

## 2019-04-06 DIAGNOSIS — I10 Essential (primary) hypertension: Secondary | ICD-10-CM

## 2019-05-14 ENCOUNTER — Other Ambulatory Visit: Payer: Self-pay

## 2019-05-14 ENCOUNTER — Encounter: Payer: Self-pay | Admitting: Family Medicine

## 2019-05-14 ENCOUNTER — Ambulatory Visit: Payer: BC Managed Care – PPO | Admitting: Family Medicine

## 2019-05-14 DIAGNOSIS — Z23 Encounter for immunization: Secondary | ICD-10-CM

## 2019-05-14 DIAGNOSIS — I10 Essential (primary) hypertension: Secondary | ICD-10-CM

## 2019-05-14 MED ORDER — LISINOPRIL 5 MG PO TABS: 5.0000 mg | ORAL_TABLET | Freq: Every day | ORAL | 1 refills | Status: DC

## 2019-05-14 NOTE — Progress Notes (Signed)
Subjective:    Patient ID: Katherine Sharp, female    DOB: 01/03/1966, 53 y.o.   MRN: [id number]  HPI  Patient presents to clinic for follow-up on blood pressure.  She takes lisinopril 5 mg and tolerates this without any problems.  She has been out of her blood pressure medicine for a few days and is noticed the numbers starting to creep back up.  Recently had lab work at her OB/GYN 2 weeks ago, we will request these records so we have them for our chart.  Patient Active Problem List   Diagnosis Date Noted  . Tinnitus of both ears 12/19/2017  . Skin lesion 12/19/2017  . Left elbow pain 10/17/2017  . Atopic dermatitis 10/17/2017  . Acute bronchitis 05/16/2016  . 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 Pain in lower jaw 08/29/2015  . ADHD (attention deficit hyperactivity disorder) 03/22/2015  . Migraines 03/22/2015  . Essential hypertension 03/22/2015  . Encounter for annual physical exam 03/15/2015  . Multiple skin nodules 03/15/2015   Social History   Tobacco Use  . Smoking status: Never Smoker  . Smokeless tobacco: Never Used  Substance Use Topics  . Alcohol use: Yes    Alcohol/week: 7.0 standard drinks    Types: 7 Glasses of wine per week   Review of Systems  Constitutional: Negative for chills, fatigue and fever.  HENT: Negative for congestion, ear pain, sinus pain and sore throat.   Eyes: Negative.   Respiratory: Negative for cough, shortness of breath and wheezing.   Cardiovascular: Negative for chest pain, palpitations and leg swelling.  Gastrointestinal: Negative for abdominal pain, diarrhea, nausea and vomiting.  Genitourinary: Negative for dysuria, frequency and urgency.  Musculoskeletal: Negative for arthralgias and myalgias.  Skin: Negative for color change, pallor and rash.  Neurological: Negative for syncope, light-headedness and headaches.  Psychiatric/Behavioral: The patient is not nervous/anxious.       Objective:   Physical Exam Vitals signs and nursing note  reviewed.  Constitutional:      General: She is not in acute distress.    Appearance: She is not ill-appearing, toxic-appearing or diaphoretic.  HENT:     Head: Normocephalic and atraumatic.  Eyes:     General: No scleral icterus.    Extraocular Movements: Extraocular movements intact.     Conjunctiva/sclera: Conjunctivae normal.     Pupils: Pupils are equal, round, and reactive to light.  Neck:     Musculoskeletal: Normal range of motion and neck supple. No neck rigidity.     Vascular: No carotid bruit.  Cardiovascular:     Rate and Rhythm: Normal rate and regular rhythm.     Heart sounds: Normal heart sounds.  Pulmonary:     Effort: Pulmonary effort is normal. No respiratory distress.     Breath sounds: Normal breath sounds.  Musculoskeletal:     Right lower leg: No edema.     Left lower leg: No edema.  Skin:    General: Skin is warm and dry.     Coloration: Skin is not jaundiced or pale.  Neurological:     General: No focal deficit present.     Mental Status: She is alert and oriented to person, place, and time.     Gait: Gait normal.  Psychiatric:        Mood and Affect: Mood normal.        Behavior: Behavior normal.    Today's Vitals   05/14/19 0929  BP: 138/90  Pulse: 74  Temp:  98.3 F (36.8 C)  TempSrc: Temporal  SpO2: 97%  Weight: 116 lb 12.8 oz (53 kg)  Height: 5\' 2"  (1.575 m)   Body mass index is 21.36 kg/m.     Assessment & Plan:    Essential hypertension-blood pressure overall well controlled with lisinopril and tolerates his medicine without any problems.  We will fax lab work from OB/GYN so we have updated lab results for our records.  Flu vaccine given in clinic  Patient will follow-up in approximately 6 months with PCP for recheck on blood pressure, aware she can return to clinic anytime with questions or concerns

## 2019-06-23 ENCOUNTER — Other Ambulatory Visit: Payer: Self-pay | Admitting: Obstetrics and Gynecology

## 2019-06-27 ENCOUNTER — Encounter (HOSPITAL_COMMUNITY)
Admission: RE | Admit: 2019-06-27 | Discharge: 2019-06-27 | Disposition: A | Discharge status: Home / Self Care | Payer: BC Managed Care – PPO | Source: Ambulatory Visit | Attending: Obstetrics and Gynecology | Admitting: Obstetrics and Gynecology

## 2019-06-27 ENCOUNTER — Other Ambulatory Visit: Payer: Self-pay

## 2019-06-27 ENCOUNTER — Other Ambulatory Visit (HOSPITAL_COMMUNITY)
Admission: RE | Admit: 2019-06-27 | Discharge: 2019-06-27 | Disposition: A | Discharge status: Home / Self Care | Payer: BC Managed Care – PPO | Source: Ambulatory Visit | Attending: Obstetrics and Gynecology | Admitting: Obstetrics and Gynecology

## 2019-06-27 DIAGNOSIS — Z20828 Contact with and (suspected) exposure to other viral communicable diseases: Secondary | ICD-10-CM | POA: Insufficient documentation

## 2019-06-27 DIAGNOSIS — Z01818 Encounter for other preprocedural examination: Secondary | ICD-10-CM | POA: Insufficient documentation

## 2019-06-27 LAB — BASIC METABOLIC PANEL
Anion gap: 9 (ref 5–15)
BUN: 11 mg/dL (ref 6–20)
CO2: 22 mmol/L (ref 22–32)
Calcium: 8.9 mg/dL (ref 8.9–10.3)
Chloride: 107 mmol/L (ref 98–111)
Creatinine, Ser: 0.73 mg/dL (ref 0.44–1.00)
GFR calc Af Amer: >60 mL/min (ref >60)
GFR calc non Af Amer: >60 mL/min (ref >60)
Glucose, Bld: 93 mg/dL (ref 70–99)
Potassium: 4 mmol/L (ref 3.5–5.1)
Sodium: 138 mmol/L (ref 135–145)

## 2019-06-27 LAB — CBC
HCT: 39.1 % (ref 36.0–46.0)
Hemoglobin: 12.7 g/dL (ref 12.0–15.0)
MCH: 28.5 pg (ref 26.0–34.0)
MCHC: 32.5 g/dL (ref 30.0–36.0)
MCV: 87.9 fL (ref 80.0–100.0)
Platelets: 223 10*3/uL (ref 150–400)
RBC: 4.45 MIL/uL (ref 3.87–5.11)
RDW: 13 % (ref 11.5–15.5)
WBC: 4.5 10*3/uL (ref 4.0–10.5)
nRBC: 0 % (ref 0.0–0.2)

## 2019-06-29 LAB — NOVEL CORONAVIRUS, NAA (HOSP ORDER, SEND-OUT TO REF LAB; TAT 18-24 HRS): SARS-CoV-2, NAA: NOT DETECTED

## 2019-06-30 ENCOUNTER — Encounter (HOSPITAL_BASED_OUTPATIENT_CLINIC_OR_DEPARTMENT_OTHER): Payer: Self-pay | Admitting: *Deleted

## 2019-06-30 ENCOUNTER — Other Ambulatory Visit: Payer: Self-pay

## 2019-06-30 NOTE — Progress Notes (Signed)
Spoke w/ via phone for pre-op interview--- PT Lab needs dos----  None  Lab results------ CBC, BMP, EKG done 06-27-2019 in chart/ epic COVID test ------ 06-27-2019 Arrive at ------- 1130 NPO after ------ MN w/ exception clear liquids until 0730 then nothing by mouth (no cream/ milk products) Medications to take morning of surgery ----- Lexapro, Northinedrone w/ sips of water Diabetic medication ----- n/a Patient Special Instructions ----- n/a Pre-Op special Istructions ----- n/a Patient verbalized understanding of instructions that were given at this phone interview. Patient denies shortness of breath, chest pain, fever, cough a this phone interview.

## 2019-07-01 ENCOUNTER — Ambulatory Visit (HOSPITAL_BASED_OUTPATIENT_CLINIC_OR_DEPARTMENT_OTHER): Payer: BC Managed Care – PPO | Admitting: Anesthesiology

## 2019-07-01 ENCOUNTER — Ambulatory Visit (HOSPITAL_BASED_OUTPATIENT_CLINIC_OR_DEPARTMENT_OTHER)
Admission: RE | Admit: 2019-07-01 | Discharge: 2019-07-01 | Disposition: A | Discharge status: Home / Self Care | Payer: BC Managed Care – PPO | Attending: Obstetrics and Gynecology | Admitting: Obstetrics and Gynecology

## 2019-07-01 ENCOUNTER — Other Ambulatory Visit: Payer: Self-pay

## 2019-07-01 ENCOUNTER — Encounter (HOSPITAL_BASED_OUTPATIENT_CLINIC_OR_DEPARTMENT_OTHER)
Admission: RE | Disposition: A | Discharge status: Home / Self Care | Payer: Self-pay | Source: Home / Self Care | Attending: Obstetrics and Gynecology

## 2019-07-01 ENCOUNTER — Encounter (HOSPITAL_BASED_OUTPATIENT_CLINIC_OR_DEPARTMENT_OTHER): Payer: Self-pay | Admitting: *Deleted

## 2019-07-01 DIAGNOSIS — Z79899 Other long term (current) drug therapy: Secondary | ICD-10-CM | POA: Insufficient documentation

## 2019-07-01 DIAGNOSIS — F329 Major depressive disorder, single episode, unspecified: Secondary | ICD-10-CM | POA: Diagnosis not present

## 2019-07-01 DIAGNOSIS — C541 Malignant neoplasm of endometrium: Secondary | ICD-10-CM | POA: Insufficient documentation

## 2019-07-01 DIAGNOSIS — I10 Essential (primary) hypertension: Secondary | ICD-10-CM | POA: Insufficient documentation

## 2019-07-01 DIAGNOSIS — F909 Attention-deficit hyperactivity disorder, unspecified type: Secondary | ICD-10-CM | POA: Insufficient documentation

## 2019-07-01 DIAGNOSIS — N840 Polyp of corpus uteri: Secondary | ICD-10-CM | POA: Diagnosis present

## 2019-07-01 DIAGNOSIS — N950 Postmenopausal bleeding: Secondary | ICD-10-CM | POA: Insufficient documentation

## 2019-07-01 HISTORY — DX: Personal history of other diseases of the female genital tract: Z87.42

## 2019-07-01 HISTORY — DX: Personal history of other diseases of the nervous system and sense organs: Z86.69

## 2019-07-01 HISTORY — PX: DILATATION & CURETTAGE/HYSTEROSCOPY WITH MYOSURE: SHX6511

## 2019-07-01 HISTORY — DX: Presence of spectacles and contact lenses: Z97.3

## 2019-07-01 HISTORY — DX: Attention-deficit hyperactivity disorder, unspecified type: F90.9

## 2019-07-01 SURGERY — DILATATION & CURETTAGE/HYSTEROSCOPY WITH MYOSURE
Anesthesia: General

## 2019-07-01 MED ORDER — OXYCODONE HCL 5 MG PO TABS
5.0000 mg | ORAL_TABLET | Freq: Once | ORAL | Status: DC | PRN
  Filled 2019-07-01: qty 1

## 2019-07-01 MED ORDER — LIDOCAINE 2% (20 MG/ML) 5 ML SYRINGE
INTRAMUSCULAR | Status: DC | PRN
  Administered 2019-07-01: 50 mg via INTRAVENOUS

## 2019-07-01 MED ORDER — MIDAZOLAM HCL 2 MG/2ML IJ SOLN
INTRAMUSCULAR | Status: DC | PRN
  Administered 2019-07-01: 2 mg via INTRAVENOUS

## 2019-07-01 MED ORDER — KETOROLAC TROMETHAMINE 30 MG/ML IJ SOLN
INTRAMUSCULAR | Status: DC | PRN
  Administered 2019-07-01: 30 mg via INTRAVENOUS

## 2019-07-01 MED ORDER — NALOXONE HCL 0.4 MG/ML IJ SOLN
INTRAMUSCULAR | Status: DC | PRN
  Administered 2019-07-01 (×2): 50 ug via INTRAVENOUS

## 2019-07-01 MED ORDER — FENTANYL CITRATE (PF) 100 MCG/2ML IJ SOLN
INTRAMUSCULAR | Status: AC
  Filled 2019-07-01: qty 2

## 2019-07-01 MED ORDER — SCOPOLAMINE 1 MG/3DAYS TD PT72
MEDICATED_PATCH | TRANSDERMAL | Status: AC
  Filled 2019-07-01: qty 1

## 2019-07-01 MED ORDER — SODIUM CHLORIDE 0.9 % IR SOLN
Status: DC | PRN
  Administered 2019-07-01: 3000 mL

## 2019-07-01 MED ORDER — FENTANYL CITRATE (PF) 100 MCG/2ML IJ SOLN
25.0000 ug | INTRAMUSCULAR | Status: DC | PRN
  Filled 2019-07-01: qty 1

## 2019-07-01 MED ORDER — LIDOCAINE 2% (20 MG/ML) 5 ML SYRINGE
INTRAMUSCULAR | Status: AC
  Filled 2019-07-01: qty 5

## 2019-07-01 MED ORDER — OXYCODONE HCL 5 MG/5ML PO SOLN
5.0000 mg | Freq: Once | ORAL | Status: DC | PRN
  Filled 2019-07-01: qty 5

## 2019-07-01 MED ORDER — DEXAMETHASONE SODIUM PHOSPHATE 10 MG/ML IJ SOLN
INTRAMUSCULAR | Status: AC
  Filled 2019-07-01: qty 1

## 2019-07-01 MED ORDER — ACETAMINOPHEN 325 MG PO TABS
ORAL_TABLET | ORAL | Status: DC | PRN
  Administered 2019-07-01: 1000 mg via ORAL

## 2019-07-01 MED ORDER — MIDAZOLAM HCL 2 MG/2ML IJ SOLN
INTRAMUSCULAR | Status: AC
  Filled 2019-07-01: qty 2

## 2019-07-01 MED ORDER — PROPOFOL 10 MG/ML IV BOLUS
INTRAVENOUS | Status: AC
  Filled 2019-07-01: qty 40

## 2019-07-01 MED ORDER — BUPIVACAINE HCL (PF) 0.25 % IJ SOLN
INTRAMUSCULAR | Status: DC | PRN
  Administered 2019-07-01: 10 mL

## 2019-07-01 MED ORDER — ONDANSETRON HCL 4 MG/2ML IJ SOLN
INTRAMUSCULAR | Status: DC | PRN
  Administered 2019-07-01: 4 mg via INTRAVENOUS

## 2019-07-01 MED ORDER — CEFAZOLIN SODIUM-DEXTROSE 2-4 GM/100ML-% IV SOLN
2.0000 g | INTRAVENOUS | Status: AC
  Administered 2019-07-01: 2 g via INTRAVENOUS
  Filled 2019-07-01: qty 100

## 2019-07-01 MED ORDER — DEXAMETHASONE SODIUM PHOSPHATE 10 MG/ML IJ SOLN
INTRAMUSCULAR | Status: DC | PRN
  Administered 2019-07-01: 5 mg via INTRAVENOUS

## 2019-07-01 MED ORDER — PROMETHAZINE HCL 25 MG/ML IJ SOLN
6.2500 mg | INTRAMUSCULAR | Status: DC | PRN
  Filled 2019-07-01: qty 1

## 2019-07-01 MED ORDER — CEFAZOLIN SODIUM-DEXTROSE 2-4 GM/100ML-% IV SOLN
INTRAVENOUS | Status: AC
  Filled 2019-07-01: qty 100

## 2019-07-01 MED ORDER — KETOROLAC TROMETHAMINE 30 MG/ML IJ SOLN
INTRAMUSCULAR | Status: AC
  Filled 2019-07-01: qty 1

## 2019-07-01 MED ORDER — FENTANYL CITRATE (PF) 100 MCG/2ML IJ SOLN
INTRAMUSCULAR | Status: DC | PRN
  Administered 2019-07-01 (×2): 50 ug via INTRAVENOUS

## 2019-07-01 MED ORDER — TRAMADOL HCL 50 MG PO TABS: 50.0000 mg | ORAL_TABLET | Freq: Four times a day (QID) | ORAL | 0 refills | Status: DC | PRN

## 2019-07-01 MED ORDER — ONDANSETRON HCL 4 MG/2ML IJ SOLN
INTRAMUSCULAR | Status: AC
  Filled 2019-07-01: qty 2

## 2019-07-01 MED ORDER — SCOPOLAMINE 1 MG/3DAYS TD PT72
MEDICATED_PATCH | TRANSDERMAL | Status: DC | PRN
  Administered 2019-07-01: 1 via TRANSDERMAL

## 2019-07-01 MED ORDER — LACTATED RINGERS IV SOLN
INTRAVENOUS | Status: DC
  Administered 2019-07-01: 12:00:00 via INTRAVENOUS
  Filled 2019-07-01: qty 1000

## 2019-07-01 MED ORDER — VASOPRESSIN 20 UNIT/ML IV SOLN
INTRAVENOUS | Status: DC | PRN
  Administered 2019-07-01: 18 mL via INTRAMUSCULAR

## 2019-07-01 MED ORDER — MEPERIDINE HCL 25 MG/ML IJ SOLN
6.2500 mg | INTRAMUSCULAR | Status: DC | PRN
  Filled 2019-07-01: qty 1

## 2019-07-01 MED ORDER — PROPOFOL 10 MG/ML IV BOLUS
INTRAVENOUS | Status: DC | PRN
  Administered 2019-07-01: 40 mg via INTRAVENOUS
  Administered 2019-07-01: 160 mg via INTRAVENOUS

## 2019-07-01 MED ORDER — ACETAMINOPHEN 500 MG PO TABS
ORAL_TABLET | ORAL | Status: AC
  Filled 2019-07-01: qty 2

## 2019-07-01 SURGICAL SUPPLY — 17 items
CATH ROBINSON RED A/P 16FR (CATHETERS) ×3 IMPLANT
DECANTER SPIKE VIAL GLASS SM (MISCELLANEOUS) ×6 IMPLANT
DEVICE MYOSURE LITE (MISCELLANEOUS) ×2 IMPLANT
GAUZE SPONGE 4X4 16PLY XRAY LF (GAUZE/BANDAGES/DRESSINGS) ×2 IMPLANT
GLOVE BIO SURGEON STRL SZ 6.5 (GLOVE) ×1 IMPLANT
GLOVE BIO SURGEON STRL SZ7.5 (GLOVE) ×3 IMPLANT
GLOVE BIO SURGEONS STRL SZ 6.5 (GLOVE) ×1
GLOVE BIOGEL PI IND STRL 7.0 (GLOVE) ×1 IMPLANT
GLOVE BIOGEL PI INDICATOR 7.0 (GLOVE) ×4
GOWN STRL REUS W/TWL LRG LVL3 (GOWN DISPOSABLE) ×6 IMPLANT
KIT PROCEDURE FLUENT (KITS) ×3 IMPLANT
NDL SPNL 22GX3.5 QUINCKE BK (NEEDLE) ×1 IMPLANT
NEEDLE SPNL 22GX3.5 QUINCKE BK (NEEDLE) ×3 IMPLANT
PACK VAGINAL MINOR WOMEN LF (CUSTOM PROCEDURE TRAY) ×3 IMPLANT
PAD OB MATERNITY 4.3X12.25 (PERSONAL CARE ITEMS) ×3 IMPLANT
SEAL ROD LENS SCOPE MYOSURE (ABLATOR) ×3 IMPLANT
TOWEL OR 17X26 10 PK STRL BLUE (TOWEL DISPOSABLE) ×4 IMPLANT

## 2019-07-01 NOTE — H&P (Signed)
Katherine Sharp is an 53 y.o. female. PMB with stuctural lesion  Pertinent Gynecological History: Menses: post-menopausal Bleeding: post menopausal bleeding Contraception: post menopausal status DES exposure: denies Blood transfusions: none Sexually transmitted diseases: no past history Previous GYN Procedures: na  Last mammogram: normal Date: 2020 Last pap: normal Date: 2020 OB History: G2, P2   Menstrual History: Menarche age: 5 Patient's last menstrual period was 07/23/2016.    Past Medical History:  Diagnosis Date  . ADHD (attention deficit hyperactivity disorder)   . Asthma    per pt mild , does not have inhaler  . Depression   . History of migraine   . History of ovarian cyst    2001 s/p RSO for hemorrhagic cyst  . Hypertension   . Wears glasses     Past Surgical History:  Procedure Laterality Date  . APPENDECTOMY  1984  . BREAST ENHANCEMENT SURGERY Bilateral 1991  . COLONOSCOPY WITH PROPOFOL N/A 10/02/2016   Procedure: COLONOSCOPY WITH PROPOFOL;  Surgeon: Manya Silvas, MD;  Location: [facility] ENDOSCOPY;  Service: Endoscopy;  Laterality: N/A;  . LAPAROSCOPIC SALPINGOOPHERECTOMY Right 2001   for hemorrhagic cyst  . TONSILLECTOMY AND ADENOIDECTOMY  1970    Family History  Problem Relation Age of Onset  . Early death Mother        Brain Tumor  . Cancer Maternal Grandmother        Uterus, Bladder, Lung and Colon    Social History:  reports that she has never smoked. She has never used smokeless tobacco. She reports current alcohol use. She reports that she does not use drugs.  Allergies:  Allergies  Allergen Reactions  . Nsaids Nausea Only    Abdominal pains and indigestion    No medications prior to admission.    Review of Systems  Constitutional: Negative.   All other systems reviewed and are negative.   Height 5\' 2"  (1.575 m), weight 52.2 kg, last menstrual period 07/23/2016. Physical Exam  Nursing note and vitals reviewed. Constitutional: She  is oriented to person, place, and time. She appears well-developed and well-nourished.  HENT:  Head: Normocephalic.  Neck: Normal range of motion. Neck supple.  Cardiovascular: Normal rate and regular rhythm.  Respiratory: Effort normal and breath sounds normal.  GI: Soft. Bowel sounds are normal.  Genitourinary:    Vagina and uterus normal.   Musculoskeletal: Normal range of motion.  Neurological: She is alert and oriented to person, place, and time. She has normal reflexes.  Skin: Skin is warm and dry.  Psychiatric: She has a normal mood and affect.    No results found for this or any previous visit (from the past 24 hour(s)).  No results found.  Assessment/Plan: PMB with structural lesion Diag HS, D&C with myosure Surgical consent done.  Jerusalem Wert J 07/01/2019, 8:01 AM

## 2019-07-01 NOTE — Anesthesia Preprocedure Evaluation (Addendum)
Anesthesia Evaluation  Patient identified by MRN, date of birth, ID band Patient awake    Reviewed: Allergy & Precautions, NPO status , Patient's Chart, lab work & pertinent test results  Airway Mallampati: I  TM Distance: >3 FB Neck ROM: Full    Dental no notable dental hx. (+) Dental Advisory Given, Teeth Intact,    Pulmonary asthma ,    Pulmonary exam normal breath sounds clear to auscultation       Cardiovascular hypertension, Pt. on medications negative cardio ROS Normal cardiovascular exam Rhythm:Regular Rate:Normal     Neuro/Psych  Headaches, PSYCHIATRIC DISORDERS Depression    GI/Hepatic negative GI ROS, Neg liver ROS,   Endo/Other  negative endocrine ROS  Renal/GU negative Renal ROS     Musculoskeletal negative musculoskeletal ROS (+)   Abdominal   Peds  Hematology negative hematology ROS (+)   Anesthesia Other Findings Hx of motion sickness  Pt relays GI upset/reflux w/ PO NSAID's ; she is Okay w/ IV NSAID   Reproductive/Obstetrics negative OB ROS                                                          Anesthesia Evaluation  Patient identified by MRN, date of birth, ID band Patient awake    Airway Mallampati: III  TM Distance: <3 FB     Dental no notable dental hx.    Pulmonary asthma ,    Pulmonary exam normal        Cardiovascular hypertension, Pt. on medications Normal cardiovascular exam     Neuro/Psych  Headaches, PSYCHIATRIC DISORDERS Depression    GI/Hepatic negative GI ROS, Neg liver ROS,   Endo/Other  negative endocrine ROS  Renal/GU negative Renal ROS  negative genitourinary   Musculoskeletal negative musculoskeletal ROS (+)   Abdominal Normal abdominal exam  (+)   Peds negative pediatric ROS (+)  Hematology negative hematology ROS (+)   Anesthesia Other Findings   Reproductive/Obstetrics                             Anesthesia Physical Anesthesia Plan  ASA: II  Anesthesia Plan: General   Post-op Pain Management:    Induction: Intravenous  Airway Management Planned: Nasal Cannula  Additional Equipment:   Intra-op Plan:   Post-operative Plan:   Informed Consent: I have reviewed the patients History and Physical, chart, labs and discussed the procedure including the risks, benefits and alternatives for the proposed anesthesia with the patient or authorized representative who has indicated his/her understanding and acceptance.   Dental advisory given  Plan Discussed with: CRNA and Surgeon  Anesthesia Plan Comments:         Anesthesia Quick Evaluation  Anesthesia Physical Anesthesia Plan  ASA: II  Anesthesia Plan: General   Post-op Pain Management:    Induction: Intravenous  PONV Risk Score and Plan: 4 or greater and Ondansetron, Dexamethasone, Treatment may vary due to age or medical condition, Scopolamine patch - Pre-op and Midazolam  Airway Management Planned: LMA  Additional Equipment: None  Intra-op Plan:   Post-operative Plan: Extubation in OR  Informed Consent: I have reviewed the patients History and Physical, chart, labs and discussed the procedure including the risks, benefits and alternatives for the proposed anesthesia with the patient or authorized  representative who has indicated his/her understanding and acceptance.     Dental advisory given  Plan Discussed with: CRNA  Anesthesia Plan Comments:         Anesthesia Quick Evaluation

## 2019-07-01 NOTE — Discharge Instructions (Signed)
DISCHARGE INSTRUCTIONS: D&C  The following instructions have been prepared to help you care for yourself upon your return home.   Personal hygiene:  Use sanitary pads for vaginal drainage, not tampons.  Shower the day after your procedure.  NO tub baths, pools or Jacuzzis for 2-3 weeks.  Wipe front to back after using the bathroom.  Activity and limitations:  Do NOT drive or operate any equipment for 24 hours. The effects of anesthesia are still present and drowsiness may result.  Do NOT rest in bed all day.  Walking is encouraged.  Walk up and down stairs slowly.  You may resume your normal activity in one to two days or as indicated by your physician.  Sexual activity: NO intercourse for at least 2 weeks after the procedure, or as indicated by your physician.  Diet: Eat a light meal as desired this evening. You may resume your usual diet tomorrow.  Return to work: You may resume your work activities in one to two days or as indicated by your doctor.  What to expect after your surgery: Expect to have vaginal bleeding/discharge for 2-3 days and spotting for up to 10 days. It is not unusual to have soreness for up to 1-2 weeks. You may have a slight burning sensation when you urinate for the first day. Mild cramps may continue for a couple of days. You may have a regular period in 2-6 weeks.  Call your doctor for any of the following:  Excessive vaginal bleeding, saturating and changing one pad every hour.  Inability to urinate 6 hours after discharge from hospital.  Pain not relieved by pain medication.  Fever of 100.4 F or greater.  Unusual vaginal discharge or odor.   Call for an appointment:   Tylenol after 7 pm as needed  Ibuprofen,Motrin after 8 pm as needed    Post Anesthesia Home Care Instructions  Activity: Get plenty of rest for the remainder of the day. A responsible individual must stay with you for 24 hours following the procedure.  For the next  24 hours, DO NOT: -Drive a car -Paediatric nurse -Drink alcoholic beverages -Take any medication unless instructed by your physician -Make any legal decisions or sign important papers.  Meals: Start with liquid foods such as gelatin or soup. Progress to regular foods as tolerated. Avoid greasy, spicy, heavy foods. If nausea and/or vomiting occur, drink only clear liquids until the nausea and/or vomiting subsides. Call your physician if vomiting continues.  Special Instructions/Symptoms: Your throat may feel dry or sore from the anesthesia or the breathing tube placed in your throat during surgery. If this causes discomfort, gargle with warm salt water. The discomfort should disappear within 24 hours.  If you had a scopolamine patch placed behind your ear for the management of post- operative nausea and/or vomiting:  1. The medication in the patch is effective for 72 hours, after which it should be removed.  Wrap patch in a tissue and discard in the trash. Wash hands thoroughly with soap and water. 2. You may remove the patch earlier than 72 hours if you experience unpleasant side effects which may include dry mouth, dizziness or visual disturbances. 3. Avoid touching the patch. Wash your hands with soap and water after contact with the patch.

## 2019-07-01 NOTE — Transfer of Care (Signed)
Immediate Anesthesia Transfer of Care Note  Patient: Katherine Sharp  Procedure(s) Performed: DILATATION & CURETTAGE/HYSTEROSCOPY WITH MYOSURE (N/A )  Patient Location: PACU  Anesthesia Type:General  Level of Consciousness: sedated and patient cooperative  Airway & Oxygen Therapy: Patient Spontanous Breathing and Patient connected to nasal cannula oxygen  Post-op Assessment: Report given to RN and Post -op Vital signs reviewed and stable  Post vital signs: Reviewed and stable  Last Vitals:  Vitals Value Taken Time  BP 119/79 07/01/19 1433  Temp    Pulse 68 07/01/19 1435  Resp 13 07/01/19 1435  SpO2 98 % 07/01/19 1435  Vitals shown include unvalidated device data.  Last Pain:  Vitals:   07/01/19 1159  TempSrc: Oral  PainSc: 0-No pain      Patients Stated Pain Goal: 5 ([id number])  Complications: No apparent anesthesia complications

## 2019-07-01 NOTE — Anesthesia Procedure Notes (Signed)
Procedure Name: LMA Insertion Date/Time: 07/01/2019 1:52 PM Performed by: Wanita Chamberlain, CRNA Pre-anesthesia Checklist: Patient identified, Timeout performed, Emergency Drugs available, Suction available and Patient being monitored Patient Re-evaluated:Patient Re-evaluated prior to induction Oxygen Delivery Method: Circle system utilized Preoxygenation: Pre-oxygenation with 100% oxygen Induction Type: IV induction Ventilation: Mask ventilation without difficulty LMA: LMA inserted LMA Size: 4.0 Number of attempts: 1 Placement Confirmation: breath sounds checked- equal and bilateral,  CO2 detector and positive ETCO2 Tube secured with: Tape Dental Injury: Teeth and Oropharynx as per pre-operative assessment

## 2019-07-01 NOTE — Progress Notes (Signed)
Patient seen and examined. Consent witnessed and signed. No changes noted. Update completed. BP 132/88   Pulse 70   Temp 98.1 F (36.7 C) (Oral)   Resp 14   Ht 5\' 2"  (1.575 m)   Wt 53.3 kg   LMP 07/23/2016   SpO2 100%   BMI 21.48 kg/m   CBC    Component Value Date/Time   WBC 4.5 06/27/2019 1316   RBC 4.45 06/27/2019 1316   HGB 12.7 06/27/2019 1316   HCT 39.1 06/27/2019 1316   PLT 223 06/27/2019 1316   MCV 87.9 06/27/2019 1316   MCH 28.5 06/27/2019 1316   MCHC 32.5 06/27/2019 1316   RDW 13.0 06/27/2019 1316   LYMPHSABS 1.3 05/24/2016 0802   MONOABS 0.4 05/24/2016 0802   EOSABS 0.1 05/24/2016 0802   BASOSABS 0.0 05/24/2016 0802

## 2019-07-01 NOTE — Op Note (Signed)
07/01/2019  2:18 PM  PATIENT:  Aileen Pilot Smullen  53 y.o. female  PRE-OPERATIVE DIAGNOSIS: PMB  POST-OPERATIVE DIAGNOSIS:  Endometrial polyp  PROCEDURE:  Procedure(s): DILATATION & CURETTAGE/HYSTEROSCOPY WITH MYOSURE  SURGEON:  Surgeon(s): Brien Few, MD  ASSISTANTS: none   ANESTHESIA:   local and general  ESTIMATED BLOOD LOSS: 5 mL   DRAINS: none   LOCAL MEDICATIONS USED:  MARCAINE    and Amount: 20 ml  SPECIMEN:  Source of Specimen:  emc and polyp  DISPOSITION OF SPECIMEN:  PATHOLOGY  COUNTS:  YES  DICTATION #: [id number]  PLAN OF CARE: dc home  PATIENT DISPOSITION:  PACU - hemodynamically stable.

## 2019-07-02 ENCOUNTER — Encounter (HOSPITAL_BASED_OUTPATIENT_CLINIC_OR_DEPARTMENT_OTHER): Payer: Self-pay | Admitting: Obstetrics and Gynecology

## 2019-07-02 LAB — SURGICAL PATHOLOGY

## 2019-07-02 NOTE — Anesthesia Postprocedure Evaluation (Signed)
Anesthesia Post Note  Patient: Katherine Sharp  Procedure(s) Performed: DILATATION & CURETTAGE/HYSTEROSCOPY WITH MYOSURE (N/A )     Patient location during evaluation: PACU Anesthesia Type: General Level of consciousness: awake and alert Pain management: pain level controlled Vital Signs Assessment: post-procedure vital signs reviewed and stable Respiratory status: spontaneous breathing, nonlabored ventilation, respiratory function stable and patient connected to nasal cannula oxygen Cardiovascular status: blood pressure returned to baseline and stable Postop Assessment: no apparent nausea or vomiting Anesthetic complications: no    Last Vitals:  Vitals:   07/01/19 1500 07/01/19 1543  BP: 129/89 133/76  Pulse: 84 66  Resp: 14 13  Temp:  36.7 C  SpO2: 98% 97%    Last Pain:  Vitals:   07/01/19 1543  TempSrc:   PainSc: 4                  Tiajuana Amass

## 2019-07-02 NOTE — Op Note (Signed)
NAMELORETTO, VILLARI MEDICAL RECORD [id number] ACCOUNT [id number] DATE OF BIRTH:[birth year] FACILITY: [facility] LOCATION: [facility] PHYSICIAN:Khristian Seals J. Akari Defelice, MD  OPERATIVE REPORT  DATE OF PROCEDURE:  07/01/2019  PREOPERATIVE DIAGNOSIS:  Postmenopausal bleeding with structural lesion.  POSTOPERATIVE DIAGNOSIS:  Endometrial polyp.  PROCEDURE:  Diagnostic hysteroscopy, dilatation and curettage with MyoSure resection of endometrial polyp.  SURGEON:  Con Memos, MD  ASSISTANT:  None.  ANESTHESIA:  Local and general.  ESTIMATED BLOOD LOSS:  5 mL.  FLUID DEFICIT:  60 mL.  COMPLICATIONS:  None.  DRAINS:  None.  COUNTS:  Correct.  DISPOSITION:  The patient to recovery in good condition.  BRIEF OPERATIVE NOTE:  After being apprised of the risks of anesthesia, infection, bleeding, and surrounding organs, possible need for repair, delayed versus immediate complications including bowel and bladder, internal vessel injury, possible need for  repair the patient was brought to the operating room and administered a general anesthetic without complications.  Prepped and draped in usual sterile fashion, catheterized until the bladder was empty.  Exam under anesthesia reveals a midposition uterus  and no adnexal masses.  Dilute Pitressin solution placed at 3 and 9 o'clock.  Standard 18 mL total dilute Marcaine solution placed 20 mL total.  Standard paracervical block.  Cervix easily dilated up to 19 Pratt dilator.  Hysteroscope placed.   Visualization reveals an anterior wall thickening along the left fundal and left lateral areas.  MyoSure device was entered.  Resection of all polypoid areas was done without difficulty.  D and C performed using sharp curettage in a 4-quadrant method.   No evidence of perforation.  Minimal bleeding.  Fluid deficit as noted.  The patient tolerated the procedure well, was awakened and transferred to recovery room in good condition.  CN/NUANCE  D:07/01/2019  T:07/01/2019 JOB:[id number]

## 2019-07-09 ENCOUNTER — Encounter: Payer: Self-pay | Admitting: Internal Medicine

## 2019-07-09 ENCOUNTER — Telehealth: Payer: Self-pay | Admitting: Internal Medicine

## 2019-07-09 DIAGNOSIS — C541 Malignant neoplasm of endometrium: Secondary | ICD-10-CM | POA: Insufficient documentation

## 2019-07-09 NOTE — Telephone Encounter (Signed)
Spoke with Dr. Ronita Hipps ob/gyn he and patient upset my chart results released into epic about biopsy and ob/gyn did not inform patient first.  He states this is reportable to the medical board and the patient is very upset about results being released to my chart.   I informed them both I am covering for 3 other providers who are out in the clinic and trying to address all phone calls and labs for each and every patient.   I spoke with patient and apologized for release of results of endometrial biopsy into my chart. Apologized to ob/gyn.   This was biopsied by ob/gyn and results CC'ed to my chart PCP and I was covering provider doing results/follow up   Resulted labs done by ob/gyn and my charted results to patient with nurse to call the patient to review results endometrial cancer results and CC Dr. Ronita Hipps ob/gyn to f/u for further treatment plans and f/u asap  Pt has appt 07/14/2019 with [facility] GYN/Onc with she is happy about   She accepts apology and wishes Happy Thanksgiving  Will inform PCP as well informed patient   [place]

## 2019-07-09 NOTE — Progress Notes (Signed)
I would like to speak to you about this issue. [phone]. Thank you.

## 2019-07-14 DIAGNOSIS — C541 Malignant neoplasm of endometrium: Secondary | ICD-10-CM | POA: Insufficient documentation

## 2019-08-05 HISTORY — PX: LAPAROSCOPIC HYSTERECTOMY: SHX1926

## 2019-09-17 DIAGNOSIS — T8131XA Disruption of external operation (surgical) wound, not elsewhere classified, initial encounter: Secondary | ICD-10-CM | POA: Insufficient documentation

## 2019-10-08 ENCOUNTER — Ambulatory Visit: Payer: BC Managed Care – PPO | Admitting: Family

## 2019-10-31 DIAGNOSIS — Z1509 Genetic susceptibility to other malignant neoplasm: Secondary | ICD-10-CM | POA: Insufficient documentation

## 2019-11-11 ENCOUNTER — Other Ambulatory Visit: Payer: Self-pay

## 2019-11-11 DIAGNOSIS — I10 Essential (primary) hypertension: Secondary | ICD-10-CM

## 2019-11-11 MED ORDER — LISINOPRIL 5 MG PO TABS: 5.0000 mg | ORAL_TABLET | Freq: Every day | ORAL | 1 refills | Status: DC

## 2019-12-26 ENCOUNTER — Other Ambulatory Visit: Payer: Self-pay | Admitting: Family

## 2019-12-26 ENCOUNTER — Telehealth: Payer: Self-pay | Admitting: Family

## 2019-12-26 ENCOUNTER — Other Ambulatory Visit: Payer: Self-pay

## 2019-12-26 ENCOUNTER — Encounter: Payer: Self-pay | Admitting: Family

## 2019-12-26 DIAGNOSIS — Z1509 Genetic susceptibility to other malignant neoplasm: Secondary | ICD-10-CM | POA: Insufficient documentation

## 2019-12-26 DIAGNOSIS — I10 Essential (primary) hypertension: Secondary | ICD-10-CM

## 2019-12-26 MED ORDER — LISINOPRIL 10 MG PO TABS: 10.0000 mg | ORAL_TABLET | Freq: Every day | ORAL | 3 refills | Status: DC

## 2019-12-26 NOTE — Telephone Encounter (Signed)
I called patient about scheduling f/u, but she did not have callander with her & will have to call back.  She did say that BP had been running higher & was unsure why HCTZ was d/c. I have that it was d/c'd by me when she saw Lauren, but unsure why this was? She said the highest she had seen was 150-140's over high 80's-90. She said that sometimes she has a HA, but can tell when it is high. She wants to know should she have HCTZ resent in? She denies CP, SOB, vision changes or left arm pain.

## 2019-12-26 NOTE — Telephone Encounter (Signed)
Call pt I would actually prefer just increasing lisinopril.  Hydrochlorothiazide can be a potent diuretic and patients can have electrolytes problems ( i.e.low potassium) abnormalities on HCTZ.    If she has any leg swelling, certainly reason for for  low-dose hydrochlorothiazide 12.5mg   however otherwise I prefer to increase lisinopril to 10mg  qd  If at the case, we would need to have her check a BMP in 1 week.  Please discuss with patient and let me know

## 2019-12-26 NOTE — Telephone Encounter (Signed)
Call pt I know we havent seen her in some time. Would like to schedule f/u appt and CPE as well as recent diagnoses.   I have rec'ed genetic paperwork and recommendations that I would like to review with her

## 2019-12-26 NOTE — Telephone Encounter (Signed)
Patient was agreeable to starting increased dose of lisinopril & I have sent. I have scheduled patient for BMP in one week & f/u 01/20/20.

## 2020-01-01 ENCOUNTER — Other Ambulatory Visit: Payer: BC Managed Care – PPO

## 2020-01-05 ENCOUNTER — Other Ambulatory Visit: Payer: BC Managed Care – PPO

## 2020-01-08 ENCOUNTER — Other Ambulatory Visit: Payer: Self-pay | Admitting: Family

## 2020-01-08 DIAGNOSIS — I10 Essential (primary) hypertension: Secondary | ICD-10-CM

## 2020-01-14 ENCOUNTER — Other Ambulatory Visit: Payer: Self-pay

## 2020-01-14 ENCOUNTER — Other Ambulatory Visit (INDEPENDENT_AMBULATORY_CARE_PROVIDER_SITE_OTHER): Payer: BC Managed Care – PPO

## 2020-01-14 DIAGNOSIS — I10 Essential (primary) hypertension: Secondary | ICD-10-CM

## 2020-01-14 LAB — BASIC METABOLIC PANEL
BUN: 13 mg/dL (ref 6–23)
CO2: 32 mEq/L (ref 19–32)
Calcium: 9.7 mg/dL (ref 8.4–10.5)
Chloride: 103 mEq/L (ref 96–112)
Creatinine, Ser: 0.69 mg/dL (ref 0.40–1.20)
GFR: 88.57 mL/min (ref >60.00)
Glucose, Bld: 94 mg/dL (ref 70–99)
Potassium: 4 mEq/L (ref 3.5–5.1)
Sodium: 139 mEq/L (ref 135–145)

## 2020-01-20 ENCOUNTER — Encounter: Payer: Self-pay | Admitting: Family

## 2020-01-20 ENCOUNTER — Ambulatory Visit (INDEPENDENT_AMBULATORY_CARE_PROVIDER_SITE_OTHER): Payer: BC Managed Care – PPO | Admitting: Family

## 2020-01-20 ENCOUNTER — Ambulatory Visit (INDEPENDENT_AMBULATORY_CARE_PROVIDER_SITE_OTHER): Payer: BC Managed Care – PPO

## 2020-01-20 ENCOUNTER — Other Ambulatory Visit: Payer: Self-pay

## 2020-01-20 VITALS — BP 138/98 | HR 89 | Temp 97.8°F | Resp 15 | Ht 62.0 in | Wt 116.6 lb

## 2020-01-20 DIAGNOSIS — Z1509 Genetic susceptibility to other malignant neoplasm: Secondary | ICD-10-CM | POA: Diagnosis not present

## 2020-01-20 DIAGNOSIS — I10 Essential (primary) hypertension: Secondary | ICD-10-CM | POA: Diagnosis not present

## 2020-01-20 DIAGNOSIS — M542 Cervicalgia: Secondary | ICD-10-CM

## 2020-01-20 DIAGNOSIS — R0789 Other chest pain: Secondary | ICD-10-CM | POA: Diagnosis not present

## 2020-01-20 MED ORDER — ALBUTEROL SULFATE HFA 108 (90 BASE) MCG/ACT IN AERS: 2.0000 | INHALATION_SPRAY | Freq: Four times a day (QID) | RESPIRATORY_TRACT | 1 refills | Status: DC | PRN

## 2020-01-20 MED ORDER — GABAPENTIN 100 MG PO CAPS: 100.0000 mg | ORAL_CAPSULE | Freq: Every day | ORAL | 3 refills | Status: DC

## 2020-01-20 MED ORDER — HYDROCHLOROTHIAZIDE 12.5 MG PO CAPS: 12.5000 mg | ORAL_CAPSULE | Freq: Every day | ORAL | 0 refills | Status: DC

## 2020-01-20 NOTE — Assessment & Plan Note (Signed)
Discussed at great length recent diagnosis and implications for surveillance as it pertains to primary care.  We will ensure we do it annual urinalysis to look for blood as well as continue annual exam with a neurologic exam.  Patient will stay incredibly vigilant for any concerns and may warrant additional evaluations.  Referral is in place for dermatology for annual skin check.  She will continue following with [facility] oncology, Dr Alycia Rossetti, her prior OB/GYN Dr Cherylann Banas , and also [facility] gastroenterology Cory Munch.  I did discuss at length that we should also screen with annual bilateral breast MRIs especially in the setting of implants.  Patient will discuss this with Dr. Alycia Rossetti and let me know as I can appropriately order

## 2020-01-20 NOTE — Assessment & Plan Note (Signed)
Patient is well-appearing on exam today.  No adventitious lung sounds. Normal HEENT exam. Patient with prior history of asthma and notes allergies are playing a role.  We agreed on conservative therapy today with trial of albuterol inhaler and chest x-ray-ensure no underlying findings.  Patient wil let me know if she does not have complete resolution of symptoms

## 2020-01-20 NOTE — Progress Notes (Signed)
Subjective:    Patient ID: Katherine Sharp, female    DOB: Jun 09, 1966, 54 y.o.   MRN: [id number]  CC: Katherine Sharp is a 54 y.o. female who presents today for follow up.   HPI: Complains of left sided neck pain x 6 months, unchanged.  Pain when looking to left side Occasionally feels tingling feeling from left side of neck to left shoulder. This has happened in the past, and did PT in the past. Thinks sitting in front and leaning toward is contributory. No HA. H/o bl breast implants  HTN- compliant with lisinopril 10mg . Had been HCTZ as ran out. Blood pressure was better controlled on HCTZ. At home, 130/ 80-90. Notes forgot to take lisinopril one morning and noticed that writing on TV was fuzzy. Letters on tv had halo effect; has had no recurrent episodes like this.  No CP, sob , tinnitus. Not a lot salt. NO Nsaid use.   Wears glasses; last eye exam was 6 months ago.    H/o asthma. No longer has inhaler as expired, felt like the would have used.  Over the past months, has noticed chest tightness.  Felt like needed inhaler again. On occasional productive cough. Sneezing.  Thinks related to allergies. Had tried been on nasal spray with no relief. No SOB, palpitations.  No leg swelling, recent immobilization. No Birth control.  Follows with psychiatry for ADHD.    Recent diagnosis of Lynch syndrome-following with [facility] gastroenterology.  Diagnosed with endometrial cancer in Fall 2020 following with Dr Cleopatra Cedar OB GYN. Surgery 08/05/19.  Following with GYN for observation endometrial cancer, recommended colonoscopy screening every 1 to 2 years. HISTORY:  Past Medical History:  Diagnosis Date  . ADHD (attention deficit hyperactivity disorder)   . Asthma    per pt mild , does not have inhaler  . Depression   . History of migraine   . History of ovarian cyst    2001 s/p RSO for hemorrhagic cyst  . Hypertension   . Wears glasses    Past Surgical History:  Procedure Laterality  Date  . APPENDECTOMY  1984  . BREAST ENHANCEMENT SURGERY Bilateral 1991  . COLONOSCOPY WITH PROPOFOL N/A 10/02/2016   Procedure: COLONOSCOPY WITH PROPOFOL;  Surgeon: Manya Silvas, MD;  Location[facility] ENDOSCOPY;  Service: Endoscopy;  Laterality: N/A;  . DILATATION & CURETTAGE/HYSTEROSCOPY WITH MYOSURE N/A 07/01/2019   Procedure: DILATATION & CURETTAGE/HYSTEROSCOPY WITH MYOSURE;  Surgeon: Brien Few, MD;  Location: [place];  Service: Gynecology;  Laterality: N/A;  . LAPAROSCOPIC HYSTERECTOMY  08/05/2019  . LAPAROSCOPIC SALPINGOOPHERECTOMY Right 2001   for hemorrhagic cyst  . TONSILLECTOMY AND ADENOIDECTOMY  1970   Family History  Problem Relation Age of Onset  . Early death Mother        Brain Tumor 30 yo  . Cancer Maternal Grandmother        Uterus, Bladder, Lung and Colon  . Uterine cancer Maternal Grandmother        lived until 23 years old  . Colon cancer Maternal Grandmother   . Bladder Cancer Maternal Grandmother   . Breast cancer Maternal Grandmother     Allergies: Nsaids Current Outpatient Medications on File Prior to Visit  Medication Sig Dispense Refill  . amphetamine-dextroamphetamine (ADDERALL XR) 30 MG 24 hr capsule Take 30 mg by mouth daily.     . calcium carbonate (TUMS - DOSED IN MG ELEMENTAL CALCIUM) 500 MG chewable tablet Chew 1 tablet by mouth as needed for  indigestion or heartburn.    . escitalopram (LEXAPRO) 20 MG tablet Take 1 tablet by mouth daily.    Marland Kitchen estradiol (ESTRACE) 0.1 MG/GM vaginal cream Place 1 g vaginally daily.     Marland Kitchen lisinopril (ZESTRIL) 10 MG tablet Take 1 tablet (10 mg total) by mouth daily. 90 tablet 3  . Multiple Vitamins-Minerals (MULTIVITAMIN ADULT PO) Take by mouth daily.    . pantoprazole (PROTONIX) 40 MG tablet Take 40 mg by mouth daily.     No current facility-administered medications on file prior to visit.    Social History   Tobacco Use  . Smoking status: Never Smoker  . Smokeless tobacco: Never Used    Substance Use Topics  . Alcohol use: Yes    Comment: rare  . Drug use: Never    Review of Systems  Constitutional: Negative for chills and fever.  HENT: Positive for congestion. Negative for hearing loss and tinnitus.   Eyes: Negative for visual disturbance.  Respiratory: Positive for cough and chest tightness. Negative for shortness of breath and wheezing.   Cardiovascular: Negative for chest pain and palpitations.  Gastrointestinal: Negative for nausea and vomiting.  Musculoskeletal: Positive for neck pain and neck stiffness. Negative for joint swelling.  Neurological: Positive for numbness.      Objective:    BP (!) 138/98 (BP Location: Left Arm, Patient Position: Sitting, Cuff Size: Normal)   Pulse 89   Temp 97.8 F (36.6 C) (Temporal)   Resp 15   Ht 5\' 2"  (1.575 m)   Wt 116 lb 9.6 oz (52.9 kg)   LMP 07/23/2016   SpO2 98%   BMI 21.33 kg/m  BP Readings from Last 3 Encounters:  01/20/20 (!) 138/98  07/01/19 133/76  05/14/19 138/90   Wt Readings from Last 3 Encounters:  01/20/20 116 lb 9.6 oz (52.9 kg)  07/01/19 117 lb 7 oz (53.3 kg)  05/14/19 116 lb 12.8 oz (53 kg)    Physical Exam Vitals reviewed.  Constitutional:      Appearance: She is well-developed.  HENT:     Head: Normocephalic and atraumatic.     Right Ear: Hearing, tympanic membrane, ear canal and external ear normal. No decreased hearing noted. No drainage, swelling or tenderness. No middle ear effusion. No foreign body. Tympanic membrane is not erythematous or bulging.     Left Ear: Hearing, tympanic membrane, ear canal and external ear normal. No decreased hearing noted. No drainage, swelling or tenderness.  No middle ear effusion. No foreign body. Tympanic membrane is not erythematous or bulging.     Nose: Nose normal. No rhinorrhea.     Right Sinus: No maxillary sinus tenderness or frontal sinus tenderness.     Left Sinus: No maxillary sinus tenderness or frontal sinus tenderness.      Mouth/Throat:     Pharynx: Uvula midline. No oropharyngeal exudate or posterior oropharyngeal erythema.     Tonsils: No tonsillar abscesses.  Eyes:     Conjunctiva/sclera: Conjunctivae normal.  Neck:      Comments: Area muscular tenderness as noted on diagram  Cardiovascular:     Rate and Rhythm: Regular rhythm.     Pulses: Normal pulses.     Heart sounds: Normal heart sounds.  Pulmonary:     Effort: Pulmonary effort is normal.     Breath sounds: Normal breath sounds. No wheezing, rhonchi or rales.  Musculoskeletal:     Cervical back: Neck supple. Spinous process tenderness and muscular tenderness present. Normal range of motion.  Lymphadenopathy:     Head:     Right side of head: No submental, submandibular, tonsillar, preauricular, posterior auricular or occipital adenopathy.     Left side of head: No submental, submandibular, tonsillar, preauricular, posterior auricular or occipital adenopathy.     Cervical: No cervical adenopathy.  Skin:    General: Skin is warm and dry.  Neurological:     Mental Status: She is alert.  Psychiatric:        Speech: Speech normal.        Behavior: Behavior normal.        Thought Content: Thought content normal.        Assessment & Plan:   Problem List Items Addressed This Visit      Cardiovascular and Mediastinum   Essential hypertension - Primary    Elevated today although I suspect this is because patient been off hydrochlorothiazide.  Patient will resume hydrochlorothiazide with labs in 1 week.  At some point may reduce lisinopril back down to 5 mg as patient had been well controlled on lisinopril 5 mg and HCTZ 12.5 mg in the past      Relevant Medications   hydrochlorothiazide (MICROZIDE) 12.5 MG capsule   Other Relevant Orders   Hemoglobin A1c   Comprehensive metabolic panel   CBC with Differential/Platelet   Lipid panel   TSH   VITAMIN D 25 Hydroxy (Vit-D Deficiency, Fractures)   Basic metabolic panel     Other   Chest  tightness    Patient is well-appearing on exam today.  No adventitious lung sounds. Normal HEENT exam. Patient with prior history of asthma and notes allergies are playing a role.  We agreed on conservative therapy today with trial of albuterol inhaler and chest x-ray-ensure no underlying findings.  Patient wil let me know if she does not have complete resolution of symptoms      Relevant Medications   albuterol (VENTOLIN HFA) 108 (90 Base) MCG/ACT inhaler   Other Relevant Orders   DG Chest 2 View   Lynch syndrome    Discussed at great length recent diagnosis and implications for surveillance as it pertains to primary care.  We will ensure we do it annual urinalysis to look for blood as well as continue annual exam with a neurologic exam.  Patient will stay incredibly vigilant for any concerns and may warrant additional evaluations.  Referral is in place for dermatology for annual skin check.  She will continue following with [facility] oncology, Dr Alycia Rossetti, her prior OB/GYN Dr Cherylann Banas , and also [facility] gastroenterology Cory Munch.  I did discuss at length that we should also screen with annual bilateral breast MRIs especially in the setting of implants.  Patient will discuss this with Dr. Alycia Rossetti and let me know as I can appropriately order      Relevant Orders   Ambulatory referral to Dermatology   Urinalysis, Routine w reflex microscopic   Neck pain    Suspect muscle spasm, degenerative disc disease playing a role.  Pending trial of gabapentin, x-ray cervical spine.  Close follow-up      Relevant Medications   gabapentin (NEURONTIN) 100 MG capsule   Other Relevant Orders   DG Cervical Spine Complete       I have discontinued Aileen Pilot. Futrell's traMADol. I am also having her start on hydrochlorothiazide, gabapentin, and albuterol. Additionally, I am having her maintain her escitalopram, amphetamine-dextroamphetamine, Multiple Vitamins-Minerals (MULTIVITAMIN ADULT PO), calcium carbonate,  lisinopril, pantoprazole, and estradiol.   Meds ordered  this encounter  Medications  . hydrochlorothiazide (MICROZIDE) 12.5 MG capsule    Sig: Take 1 capsule (12.5 mg total) by mouth daily.    Dispense:  90 capsule    Refill:  0    Order Specific Question:   Supervising Provider    Answer:   Deborra Medina L [[id number]]  . gabapentin (NEURONTIN) 100 MG capsule    Sig: Take 1 capsule (100 mg total) by mouth at bedtime.    Dispense:  90 capsule    Refill:  3    Order Specific Question:   Supervising Provider    Answer:   Deborra Medina L [[id number]]  . albuterol (VENTOLIN HFA) 108 (90 Base) MCG/ACT inhaler    Sig: Inhale 2 puffs into the lungs every 6 (six) hours as needed for wheezing or shortness of breath.    Dispense:  6.7 g    Refill:  1    Order Specific Question:   Supervising Provider    Answer:   Crecencio Mc [[id number]]    Return precautions given.   Risks, benefits, and alternatives of the medications and treatment plan prescribed today were discussed, and patient expressed understanding.   Education regarding symptom management and diagnosis given to patient on AVS.  Continue to follow with Burnard Hawthorne, FNP for routine health maintenance.   [name] and I agreed with plan.   Mable Paris, FNP

## 2020-01-20 NOTE — Assessment & Plan Note (Signed)
Elevated today although I suspect this is because patient been off hydrochlorothiazide.  Patient will resume hydrochlorothiazide with labs in 1 week.  At some point may reduce lisinopril back down to 5 mg as patient had been well controlled on lisinopril 5 mg and HCTZ 12.5 mg in the past

## 2020-01-20 NOTE — Assessment & Plan Note (Signed)
Suspect muscle spasm, degenerative disc disease playing a role.  Pending trial of gabapentin, x-ray cervical spine.  Close follow-up

## 2020-01-20 NOTE — Patient Instructions (Addendum)
Trial of gabapentin  Please clarify with Dr Alycia Rossetti it any utility in screening with breast MRI annually.   Referral to dermatology. Let us know if you dont hear back within a week in regards to an appointment being scheduled.   Start HCTZ  It is imperative that you are seen AT least twice per year for labs and monitoring. Monitor blood pressure at home and me 5-6 reading on separate days. Goal is less than 120/80, based on newest guidelines, however we certainly want to be less than 130/80;  if persistently higher, please make sooner follow up appointment so we can recheck you blood pressure and manage/ adjust medications.

## 2020-01-28 ENCOUNTER — Other Ambulatory Visit: Payer: Self-pay

## 2020-01-28 ENCOUNTER — Other Ambulatory Visit (INDEPENDENT_AMBULATORY_CARE_PROVIDER_SITE_OTHER): Payer: BC Managed Care – PPO

## 2020-01-28 DIAGNOSIS — I10 Essential (primary) hypertension: Secondary | ICD-10-CM | POA: Diagnosis not present

## 2020-01-28 DIAGNOSIS — Z1509 Genetic susceptibility to other malignant neoplasm: Secondary | ICD-10-CM | POA: Diagnosis not present

## 2020-01-28 LAB — URINALYSIS, ROUTINE W REFLEX MICROSCOPIC
Bilirubin Urine: NEGATIVE
Hgb urine dipstick: NEGATIVE
Ketones, ur: NEGATIVE
Leukocytes,Ua: NEGATIVE
Nitrite: NEGATIVE
RBC / HPF: NONE SEEN (ref 0–?)
Specific Gravity, Urine: 1.01 (ref 1.000–1.030)
Total Protein, Urine: NEGATIVE
Urine Glucose: NEGATIVE
Urobilinogen, UA: 0.2 (ref 0.0–1.0)
pH: 6.5 (ref 5.0–8.0)

## 2020-01-28 LAB — BASIC METABOLIC PANEL
BUN: 16 mg/dL (ref 6–23)
CO2: 31 mEq/L (ref 19–32)
Calcium: 9.1 mg/dL (ref 8.4–10.5)
Chloride: 103 mEq/L (ref 96–112)
Creatinine, Ser: 0.72 mg/dL (ref 0.40–1.20)
GFR: 84.31 mL/min (ref >60.00)
Glucose, Bld: 105 mg/dL — ABNORMAL HIGH (ref 70–99)
Potassium: 4.1 mEq/L (ref 3.5–5.1)
Sodium: 140 mEq/L (ref 135–145)

## 2020-01-28 LAB — CBC WITH DIFFERENTIAL/PLATELET
Basophils Absolute: 0 10*3/uL (ref 0.0–0.1)
Basophils Relative: 0.8 % (ref 0.0–3.0)
Eosinophils Absolute: 0.1 10*3/uL (ref 0.0–0.7)
Eosinophils Relative: 3.3 % (ref 0.0–5.0)
HCT: 41.1 % (ref 36.0–46.0)
Hemoglobin: 13.7 g/dL (ref 12.0–15.0)
Lymphocytes Relative: 36.7 % (ref 12.0–46.0)
Lymphs Abs: 1.4 10*3/uL (ref 0.7–4.0)
MCHC: 33.4 g/dL (ref 30.0–36.0)
MCV: 83.9 fl (ref 78.0–100.0)
Monocytes Absolute: 0.4 10*3/uL (ref 0.1–1.0)
Monocytes Relative: 9.2 % (ref 3.0–12.0)
Neutro Abs: 1.9 10*3/uL (ref 1.4–7.7)
Neutrophils Relative %: 50 % (ref 43.0–77.0)
Platelets: 194 10*3/uL (ref 150.0–400.0)
RBC: 4.9 Mil/uL (ref 3.87–5.11)
RDW: 14.3 % (ref 11.5–15.5)
WBC: 3.8 10*3/uL — ABNORMAL LOW (ref 4.0–10.5)

## 2020-01-28 LAB — COMPREHENSIVE METABOLIC PANEL
ALT: 23 U/L (ref 0–35)
AST: 24 U/L (ref 0–37)
Albumin: 4.6 g/dL (ref 3.5–5.2)
Alkaline Phosphatase: 53 U/L (ref 39–117)
BUN: 16 mg/dL (ref 6–23)
CO2: 31 mEq/L (ref 19–32)
Calcium: 9.1 mg/dL (ref 8.4–10.5)
Chloride: 103 mEq/L (ref 96–112)
Creatinine, Ser: 0.72 mg/dL (ref 0.40–1.20)
GFR: 84.31 mL/min (ref >60.00)
Glucose, Bld: 105 mg/dL — ABNORMAL HIGH (ref 70–99)
Potassium: 4.1 mEq/L (ref 3.5–5.1)
Sodium: 140 mEq/L (ref 135–145)
Total Bilirubin: 0.4 mg/dL (ref 0.2–1.2)
Total Protein: 6.6 g/dL (ref 6.0–8.3)

## 2020-01-28 LAB — LIPID PANEL
Cholesterol: 191 mg/dL (ref 0–200)
HDL: 79.8 mg/dL (ref >39.00)
LDL Cholesterol: 94 mg/dL (ref 0–99)
NonHDL: 111.25
Total CHOL/HDL Ratio: 2
Triglycerides: 85 mg/dL (ref 0.0–149.0)
VLDL: 17 mg/dL (ref 0.0–40.0)

## 2020-01-28 LAB — TSH: TSH: 1.04 u[IU]/mL (ref 0.35–4.50)

## 2020-01-28 LAB — VITAMIN D 25 HYDROXY (VIT D DEFICIENCY, FRACTURES): VITD: 38.59 ng/mL (ref 30.00–100.00)

## 2020-01-29 LAB — HEMOGLOBIN A1C: Hgb A1c MFr Bld: 5.5 % (ref 4.6–6.5)

## 2020-01-30 ENCOUNTER — Other Ambulatory Visit: Payer: Self-pay | Admitting: Family

## 2020-01-30 DIAGNOSIS — R7989 Other specified abnormal findings of blood chemistry: Secondary | ICD-10-CM

## 2020-03-09 ENCOUNTER — Encounter: Payer: Self-pay | Admitting: Family

## 2020-03-09 ENCOUNTER — Ambulatory Visit (INDEPENDENT_AMBULATORY_CARE_PROVIDER_SITE_OTHER): Payer: BC Managed Care – PPO | Admitting: Family

## 2020-03-09 ENCOUNTER — Other Ambulatory Visit: Payer: Self-pay

## 2020-03-09 VITALS — BP 130/104 | HR 89 | Temp 98.0°F | Resp 14 | Ht 62.0 in | Wt 118.8 lb

## 2020-03-09 DIAGNOSIS — M542 Cervicalgia: Secondary | ICD-10-CM | POA: Diagnosis not present

## 2020-03-09 DIAGNOSIS — I10 Essential (primary) hypertension: Secondary | ICD-10-CM

## 2020-03-09 MED ORDER — LISINOPRIL 5 MG PO TABS: 5.0000 mg | ORAL_TABLET | Freq: Every day | ORAL | 3 refills | Status: DC | PRN

## 2020-03-09 MED ORDER — DICLOFENAC SODIUM 1 % EX GEL: 4.0000 g | Freq: Four times a day (QID) | CUTANEOUS | 1 refills | Status: DC

## 2020-03-09 MED ORDER — GABAPENTIN 100 MG PO CAPS: 200.0000 mg | ORAL_CAPSULE | Freq: Every day | ORAL | 3 refills | Status: DC

## 2020-03-09 NOTE — Assessment & Plan Note (Signed)
Appears labile when looking at home readings. Have given her prn 5mg  lisinopril to take BP > 130/90. We will assess at follow up.

## 2020-03-09 NOTE — Assessment & Plan Note (Addendum)
Unchanged. Will increase gabapentin. Trial voltaren gel. Politely declines referral to orthopedics today, she will call me know if she would like to pursue this.

## 2020-03-09 NOTE — Progress Notes (Signed)
Subjective:    Patient ID: Katherine Sharp, female    DOB: Jan 15, 1966, 54 y.o.   MRN: [id number]  CC: Katherine HENRI is a 54 y.o. female who presents today for follow up.   HPI: Feels well today No new complaints  HTN- 135/98, 94/75, 131/ 85, 105/70. Compliant with lisinopril and hctz.  Has gained 20 lbs pounds over last year. 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   Neck pain- some improvement with gabapentin however doesn't last very long. Had PT with tens unit in the past which was helpful. Trying home excercises that she learned without much relief.   ADHD- recently lowered adderall to 15mg  XR with Dr Toy Care.   Breathing is better. Using albuterol prn  ? Mri breasts- Dr Paul Half  Repeat cbc scheduled HISTORY:  Past Medical History:  Diagnosis Date   ADHD (attention deficit hyperactivity disorder)    Asthma    per pt mild , does not have inhaler   Depression    History of migraine    History of ovarian cyst    2001 s/p RSO for hemorrhagic cyst   Hypertension    Wears glasses    Past Surgical History:  Procedure Laterality Date   APPENDECTOMY  1984   BREAST ENHANCEMENT SURGERY Bilateral 1991   COLONOSCOPY WITH PROPOFOL N/A 10/02/2016   Procedure: COLONOSCOPY WITH PROPOFOL;  Surgeon: Katherine Silvas, MD;  Location: [place];  Service: Endoscopy;  Laterality: N/A;   DILATATION & CURETTAGE/HYSTEROSCOPY WITH MYOSURE N/A 07/01/2019   Procedure: DILATATION & CURETTAGE/HYSTEROSCOPY WITH MYOSURE;  Surgeon: Katherine Few, MD;  Location: [place];  Service: Gynecology;  Laterality: N/A;   LAPAROSCOPIC HYSTERECTOMY  08/05/2019   LAPAROSCOPIC SALPINGOOPHERECTOMY Right 2001   for hemorrhagic cyst   TONSILLECTOMY AND ADENOIDECTOMY  1970   Family History  Problem Relation Age of Onset   Early death Mother        Brain Tumor 61 yo   Cancer Maternal Grandmother        Uterus, Bladder, Lung and Colon     Uterine cancer Maternal Grandmother        lived until 14 years old   Colon cancer Maternal Grandmother    Bladder Cancer Maternal Grandmother    Breast cancer Maternal Grandmother     Allergies: Nsaids Current Outpatient Medications on File Prior to Visit  Medication Sig Dispense Refill   albuterol (VENTOLIN HFA) 108 (90 Base) MCG/ACT inhaler Inhale 2 puffs into the lungs every 6 (six) hours as needed for wheezing or shortness of breath. 6.7 g 1   amphetamine-dextroamphetamine (ADDERALL XR) 15 MG 24 hr capsule Take by mouth every morning.     calcium carbonate (TUMS - DOSED IN MG ELEMENTAL CALCIUM) 500 MG chewable tablet Chew 1 tablet by mouth as needed for indigestion or heartburn.     escitalopram (LEXAPRO) 20 MG tablet Take 1 tablet by mouth daily.     estradiol (ESTRACE) 0.1 MG/GM vaginal cream Place 1 g vaginally daily.      hydrochlorothiazide (MICROZIDE) 12.5 MG capsule Take 1 capsule (12.5 mg total) by mouth daily. 90 capsule 0   lisinopril (ZESTRIL) 10 MG tablet Take 1 tablet (10 mg total) by mouth daily. 90 tablet 3   Multiple Vitamins-Minerals (MULTIVITAMIN ADULT PO) Take by mouth daily.     pantoprazole (PROTONIX) 40 MG tablet Take 40 mg by mouth daily.     No current facility-administered medications on file  prior to visit.    Social History   Tobacco Use   Smoking status: Never Smoker   Smokeless tobacco: Never Used  Vaping Use   Vaping Use: Never used  Substance Use Topics   Alcohol use: Yes    Comment: rare   Drug use: Never    Review of Systems  Constitutional: Negative for chills and fever.  Respiratory: Negative for cough.   Cardiovascular: Negative for chest pain and palpitations.  Gastrointestinal: Negative for nausea and vomiting.      Objective:    BP (!) 130/104 (BP Location: Left Arm, Patient Position: Sitting, Cuff Size: Normal)    Pulse 89    Temp 98 F (36.7 C) (Oral)    Resp 14    Ht 5\' 2"  (1.575 m)    Wt 118 lb 12.8 oz  (53.9 kg)    LMP 07/23/2016    SpO2 98%    BMI 21.73 kg/m  BP Readings from Last 3 Encounters:  03/09/20 (!) 130/104  01/20/20 (!) 138/98  07/01/19 133/76   Wt Readings from Last 3 Encounters:  03/09/20 118 lb 12.8 oz (53.9 kg)  01/20/20 116 lb 9.6 oz (52.9 kg)  07/01/19 117 lb 7 oz (53.3 kg)    Physical Exam Vitals reviewed.  Constitutional:      Appearance: She is well-developed.  Eyes:     Conjunctiva/sclera: Conjunctivae normal.  Cardiovascular:     Rate and Rhythm: Normal rate and regular rhythm.     Pulses: Normal pulses.     Heart sounds: Normal heart sounds.  Pulmonary:     Effort: Pulmonary effort is normal.     Breath sounds: Normal breath sounds. No wheezing, rhonchi or rales.  Skin:    General: Skin is warm and dry.  Neurological:     Mental Status: She is alert.  Psychiatric:        Speech: Speech normal.        Behavior: Behavior normal.        Thought Content: Thought content normal.        Assessment & Plan:   Problem List Items Addressed This Visit      Cardiovascular and Mediastinum   Essential hypertension - Primary    Appears labile when looking at home readings. Have given her prn 5mg  lisinopril to take BP > 130/90. We will assess at follow up.      Relevant Medications   lisinopril (ZESTRIL) 5 MG tablet     Other   Neck pain    Unchanged. Will increase gabapentin. Trial voltaren gel. Politely declines referral to orthopedics today, she will call me know if she would like to pursue this.      Relevant Medications   diclofenac Sodium (VOLTAREN) 1 % GEL   gabapentin (NEURONTIN) 100 MG capsule       I have changed Aileen Pilot. [place]'s gabapentin. I am also having her start on lisinopril and diclofenac Sodium. Additionally, I am having her maintain her escitalopram, Multiple Vitamins-Minerals (MULTIVITAMIN ADULT PO), calcium carbonate, lisinopril, pantoprazole, estradiol, hydrochlorothiazide, albuterol, and  amphetamine-dextroamphetamine.   Meds ordered this encounter  Medications   lisinopril (ZESTRIL) 5 MG tablet    Sig: Take 1 tablet (5 mg total) by mouth daily as needed. If BP > 130/90    Dispense:  90 tablet    Refill:  3    Order Specific Question:   Supervising Provider    Answer:   Katherine Sharp L [[id number]]   diclofenac Sodium (  VOLTAREN) 1 % GEL    Sig: Apply 4 g topically 4 (four) times daily.    Dispense:  50 g    Refill:  1    Order Specific Question:   Supervising Provider    Answer:   Katherine Sharp L [[id number]]   gabapentin (NEURONTIN) 100 MG capsule    Sig: Take 2 capsules (200 mg total) by mouth at bedtime.    Dispense:  120 capsule    Refill:  3    Order Specific Question:   Supervising Provider    Answer:   Crecencio Mc [[id number]]    Return precautions given.   Risks, benefits, and alternatives of the medications and treatment plan prescribed today were discussed, and patient expressed understanding.   Education regarding symptom management and diagnosis given to patient on AVS.  Continue to follow with Burnard Hawthorne, FNP for routine health maintenance.   [name] and I agreed with plan.   Mable Paris, FNP

## 2020-03-09 NOTE — Patient Instructions (Signed)
lisinopril 5mg  if BP > 130/90  I would like you to see orthopedic , one that is specialized in the spine/neck.   Try gabapentin 200mg  at bedtime.   Let me know if you need a referral

## 2020-03-22 ENCOUNTER — Other Ambulatory Visit (INDEPENDENT_AMBULATORY_CARE_PROVIDER_SITE_OTHER): Payer: BC Managed Care – PPO

## 2020-03-22 ENCOUNTER — Other Ambulatory Visit: Payer: Self-pay

## 2020-03-22 DIAGNOSIS — R7989 Other specified abnormal findings of blood chemistry: Secondary | ICD-10-CM | POA: Diagnosis not present

## 2020-03-23 LAB — CBC WITH DIFFERENTIAL/PLATELET
Basophils Absolute: 0 10*3/uL (ref 0.0–0.1)
Basophils Relative: 1.1 % (ref 0.0–3.0)
Eosinophils Absolute: 0.1 10*3/uL (ref 0.0–0.7)
Eosinophils Relative: 1.5 % (ref 0.0–5.0)
HCT: 37.7 % (ref 36.0–46.0)
Hemoglobin: 12.9 g/dL (ref 12.0–15.0)
Lymphocytes Relative: 34.5 % (ref 12.0–46.0)
Lymphs Abs: 1.5 10*3/uL (ref 0.7–4.0)
MCHC: 34.2 g/dL (ref 30.0–36.0)
MCV: 85 fl (ref 78.0–100.0)
Monocytes Absolute: 0.4 10*3/uL (ref 0.1–1.0)
Monocytes Relative: 10.5 % (ref 3.0–12.0)
Neutro Abs: 2.2 10*3/uL (ref 1.4–7.7)
Neutrophils Relative %: 52.4 % (ref 43.0–77.0)
Platelets: 223 10*3/uL (ref 150.0–400.0)
RBC: 4.43 Mil/uL (ref 3.87–5.11)
RDW: 14 % (ref 11.5–15.5)
WBC: 4.2 10*3/uL (ref 4.0–10.5)

## 2020-04-16 ENCOUNTER — Other Ambulatory Visit: Payer: Self-pay | Admitting: Family

## 2020-04-16 DIAGNOSIS — I10 Essential (primary) hypertension: Secondary | ICD-10-CM

## 2020-04-30 ENCOUNTER — Telehealth (INDEPENDENT_AMBULATORY_CARE_PROVIDER_SITE_OTHER): Payer: BC Managed Care – PPO | Admitting: Family

## 2020-04-30 ENCOUNTER — Encounter: Payer: Self-pay | Admitting: Family

## 2020-04-30 ENCOUNTER — Other Ambulatory Visit: Payer: Self-pay

## 2020-04-30 DIAGNOSIS — I10 Essential (primary) hypertension: Secondary | ICD-10-CM | POA: Diagnosis not present

## 2020-04-30 DIAGNOSIS — J45909 Unspecified asthma, uncomplicated: Secondary | ICD-10-CM | POA: Insufficient documentation

## 2020-04-30 DIAGNOSIS — U071 COVID-19: Secondary | ICD-10-CM

## 2020-04-30 DIAGNOSIS — J4520 Mild intermittent asthma, uncomplicated: Secondary | ICD-10-CM

## 2020-04-30 DIAGNOSIS — Z8616 Personal history of COVID-19: Secondary | ICD-10-CM | POA: Insufficient documentation

## 2020-04-30 NOTE — Assessment & Plan Note (Addendum)
No respiratory distress nor labored in speech. Sa02 99. Due to history asthma, I have advised monoclonal antibodies. She has started vitamins and I have advised her read mychart AVS summary for all my recommendations in regards to vitamins and in particular monitoring SaO2. She will let me know how she is doing.

## 2020-04-30 NOTE — Patient Instructions (Addendum)
We will call respiratory clinic regarding monoclonal antibody and if you qualify.   Try Clorcidin HBP.  Please start PLAIN  Mucinex DM to take at bedtime for cough.     Please begin Vit C 1000 mg daily   Vitamin D3 4000 IU daily   Zinc 100 g daily   Quercetin 250 mg -500 mg twice daily    Rest, hydrate very well, eat healthy protein food, Tylenol or Advil as directed .    Please go to Acute Care if symptoms worsen but are not an emergency. Office video visit again early next week.    Remain in self quarantine until at least 10 days since symptom onset AND 3 consecutive days fever free without antipyretics AND improvement in respiratory symptoms.    Utilize over the counter medications to treat symptoms.   Seek  treatment in the ED if respiratory issues/distress develops or unrelieved chest pain. Or, if you become severely weak, dehydrated, confused.    Only leave home to seek medical care and must wear a mask in public. Limit contact with family members or caregivers in the home and to notify her family who she was with yesterday that they should quarantine for 14 days. Practice social distancing and to continue to use good preventative care measures such has frequent hand washing.      Your COVID-19 test was resulted as positive.    You should continue your self imposed quantarine until you can answer "yes" to ALL 3 of the conditions below:   1) Your symptoms (fever, cough, shortness of breath) started 7 or more days ago   2) Your body temperature  has been normal for at least 72 hours (WITHOUT the use of any tylenol, motrin or aleve) . Normal is < 100.4 Farenheit  3) your other flu  like symptoms symptoms are getting better.   YOUR FAMILY or other household contacts, however , even if they feel fine , will need to continue to quarantining themselves  (that means no contact with ANYONE outside of the house)  for 14 days STARTING from the end of your initial 7 day period . (why?  because they have been theoretically exposed to you during the entire 7 days of your illness, and they can sheD the virus without symptoms for that period of time ).         10 Things You Can Do to Manage Your COVID-19 Symptoms at Home If you have possible or confirmed COVID-19: 1. Stay home from work and school. And stay away from other public places. If you must go out, avoid using any kind of public transportation, ridesharing, or taxis. 2. Monitor your symptoms carefully. If your symptoms get worse, call your healthcare provider immediately. 3. Get rest and stay hydrated. 4. If you have a medical appointment, call the healthcare provider ahead of time and tell them that you have or may have COVID-19. 5. For medical emergencies, call 911 and notify the dispatch personnel that you have or may have COVID-19. 6. Cover your cough and sneezes with a tissue or use the inside of your elbow. 7. Wash your hands often with soap and water for at least 20 seconds or clean your hands with an alcohol-based hand sanitizer that contains at least 60% alcohol. 8. As much as possible, stay in a specific room and away from other people in your home. Also, you should use a separate bathroom, if available. If you need to be around other people in  or outside of the home, wear a mask. 9. Avoid sharing personal items with other people in your household, like dishes, towels, and bedding. 10. Clean all surfaces that are touched often, like counters, tabletops, and doorknobs. Use household cleaning sprays or wipes according to the label instructions. [email or web address] 02/12/2019

## 2020-04-30 NOTE — Assessment & Plan Note (Addendum)
Improved. Hasnt needed lisinopril 5mg . She will continue to monitor at home.

## 2020-04-30 NOTE — Progress Notes (Signed)
Virtual Visit via Video Note  I connected with@  on 05/04/20 at  4:00 PM EDT by a video enabled telemedicine application and verified that I am speaking with the correct person using two identifiers.  Location patient: home Location provider:work  Persons participating in the virtual visit: patient, provider  I discussed the limitations of evaluation and management by telemedicine and the availability of in person appointments. The patient expressed understanding and agreed to proceed.   HPI: CC: Positive for covid Primary symptom is wet cough which start 4 days ago, 4 days symptoms started and covid test positive.   Sao2 99% earlier today when walking around block.  No sob, cp, leg swelling, fever, diarrhea, wheezing.   Has been using albuterol with relief and prn tylenol.  Hasnt taken decongestant.  She has started Vit D, Zinc, Vitamin C. Fully vaccinated.  Husband has covid as well.    H/o asthma  HTN-improved. compliant Hctz 12.5mg . At home 107/77. Hasnt needed  prn 5mg  lisinopril    ROS: See pertinent positives and negatives per HPI.    EXAM:  VITALS per patient if applicable:  GENERAL: alert, oriented, appears well and in no acute distress  HEENT: atraumatic, conjunttiva clear, no obvious abnormalities on inspection of external nose and ears  NECK: normal movements of the head and neck  LUNGS: on inspection no signs of respiratory distress, breathing rate appears normal, no obvious gross SOB, gasping or wheezing. Wet sounding cough  CV: no obvious cyanosis  MS: moves all visible extremities without noticeable abnormality  PSYCH/NEURO: pleasant and cooperative, no obvious depression or anxiety, speech and thought processing grossly intact  ASSESSMENT AND PLAN:  Discussed the following assessment and plan:  Problem List Items Addressed This Visit      Cardiovascular and Mediastinum   Essential hypertension    Improved. Hasnt needed lisinopril 5mg . She  will continue to monitor at home.         Respiratory   Asthma     Other   COVID-19    No respiratory distress nor labored in speech. Sa02 99. Due to history asthma, I have advised monoclonal antibodies. She has started vitamins and I have advised her read mychart AVS summary for all my recommendations in regards to vitamins and in particular monitoring SaO2. She will let me know how she is doing.          -we discussed possible serious and likely etiologies, options for evaluation and workup, limitations of telemedicine visit vs in person visit, treatment, treatment risks and precautions. Pt prefers to treat via telemedicine empirically rather then risking or undertaking an in person visit at this moment.  .   I discussed the assessment and treatment plan with the patient. The patient was provided an opportunity to ask questions and all were answered. The patient agreed with the plan and demonstrated an understanding of the instructions.   The patient was advised to call back or seek an in-person evaluation if the symptoms worsen or if the condition fails to improve as anticipated.   Mable Paris, FNP

## 2020-05-03 ENCOUNTER — Ambulatory Visit (HOSPITAL_COMMUNITY)
Admission: RE | Admit: 2020-05-03 | Discharge: 2020-05-03 | Disposition: A | Discharge status: Home / Self Care | Payer: BC Managed Care – PPO | Source: Ambulatory Visit | Attending: Pulmonary Disease | Admitting: Pulmonary Disease

## 2020-05-03 VITALS — BP 129/87 | HR 60 | Temp 98.4°F | Resp 16

## 2020-05-03 DIAGNOSIS — U071 COVID-19: Secondary | ICD-10-CM | POA: Diagnosis present

## 2020-05-03 MED ORDER — METHYLPREDNISOLONE SODIUM SUCC 125 MG IJ SOLR: 125.0000 mg | Freq: Once | INTRAMUSCULAR | Status: DC | PRN

## 2020-05-03 MED ORDER — SODIUM CHLORIDE 0.9 % IV SOLN
1200.0000 mg | Freq: Once | INTRAVENOUS | Status: AC
  Administered 2020-05-03: 1200 mg via INTRAVENOUS

## 2020-05-03 MED ORDER — SODIUM CHLORIDE 0.9 % IV SOLN: INTRAVENOUS | Status: DC | PRN

## 2020-05-03 MED ORDER — FAMOTIDINE IN NACL 20-0.9 MG/50ML-% IV SOLN: 20.0000 mg | Freq: Once | INTRAVENOUS | Status: DC | PRN

## 2020-05-03 MED ORDER — EPINEPHRINE 0.3 MG/0.3ML IJ SOAJ: 0.3000 mg | Freq: Once | INTRAMUSCULAR | Status: DC | PRN

## 2020-05-03 MED ORDER — ALBUTEROL SULFATE HFA 108 (90 BASE) MCG/ACT IN AERS: 2.0000 | INHALATION_SPRAY | Freq: Once | RESPIRATORY_TRACT | Status: DC | PRN

## 2020-05-03 MED ORDER — DIPHENHYDRAMINE HCL 50 MG/ML IJ SOLN: 50.0000 mg | Freq: Once | INTRAMUSCULAR | Status: DC | PRN

## 2020-05-03 NOTE — Progress Notes (Signed)
  Diagnosis: COVID-19  Physician: Dr. Asencion Noble  Procedure: [facility] Infusion Clinic Med: casirivimab\imdevimab infusion - Provided patient with casirivimab\imdevimab fact sheet for patients, parents and caregivers prior to infusion.  Complications: No immediate complications noted.  Discharge: Discharged home   Gaye Alken 05/03/2020

## 2020-05-03 NOTE — Discharge Instructions (Signed)

## 2020-05-20 ENCOUNTER — Encounter: Payer: Self-pay | Admitting: Family

## 2020-05-21 ENCOUNTER — Other Ambulatory Visit: Payer: Self-pay | Admitting: Family

## 2020-05-21 DIAGNOSIS — M542 Cervicalgia: Secondary | ICD-10-CM

## 2020-05-25 ENCOUNTER — Ambulatory Visit: Payer: BC Managed Care – PPO | Admitting: Family Medicine

## 2020-05-25 ENCOUNTER — Ambulatory Visit (INDEPENDENT_AMBULATORY_CARE_PROVIDER_SITE_OTHER): Payer: BC Managed Care – PPO | Admitting: Family Medicine

## 2020-05-25 ENCOUNTER — Encounter: Payer: Self-pay | Admitting: Family Medicine

## 2020-05-25 ENCOUNTER — Other Ambulatory Visit: Payer: Self-pay

## 2020-05-25 VITALS — BP 110/82 | HR 73 | Ht 62.0 in | Wt 120.0 lb

## 2020-05-25 DIAGNOSIS — M542 Cervicalgia: Secondary | ICD-10-CM

## 2020-05-25 DIAGNOSIS — M5412 Radiculopathy, cervical region: Secondary | ICD-10-CM | POA: Diagnosis not present

## 2020-05-25 DIAGNOSIS — Z8616 Personal history of COVID-19: Secondary | ICD-10-CM | POA: Diagnosis not present

## 2020-05-25 DIAGNOSIS — Z1509 Genetic susceptibility to other malignant neoplasm: Secondary | ICD-10-CM

## 2020-05-25 MED ORDER — TIZANIDINE HCL 2 MG PO TABS: 2.0000 mg | ORAL_TABLET | Freq: Four times a day (QID) | ORAL | 1 refills | Status: DC | PRN

## 2020-05-25 NOTE — Progress Notes (Signed)
Subjective:   I, Katherine Sharp, am serving as a scribe for Dr. Lynne Leader.  I'm seeing this patient as a consultation for:  Burnard Hawthorne, FNP. Note will be routed back to referring provider/PCP.  CC: Neck pain   HPI: Patient is a 54 year old Female presenting to [place] at [facility] for Neck pain. Patient locates pain to L side Neck and shoulder going on for X1year. Can only turn head so far to the left. Has had an xray of cervical spine by PCP on 01/21/2020 that showed Arthritic changes evidenced by moderate disc space loss C4-C5. States that she has had this happen in the past and went to PT and got exercises that helped. Patient has been trying all those exercises and they are not helping this time. Patient states having a very hard time sleeping at night because of the pain.   Radiating: yes down L arm Aggravating symptoms: turning head to the left Tried: Gabapentin; voltaren gel; tens;massage; advil;   Past medical history, Surgical history, Family history, Social history, Allergies, and medications have been entered into the medical record, reviewed. 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  Covid last month.  Fortunately patient was vaccinated and had mild disease.  Her husband however was nonvaccinated and recently was discharged from the ICU.  Review of Systems: No new headache, visual changes, nausea, vomiting, diarrhea, constipation, dizziness, abdominal pain, skin rash, fevers, chills, night sweats, weight loss, swollen lymph nodes, body aches, joint swelling, muscle aches, chest pain, shortness of breath, mood changes, visual or auditory hallucinations.   Objective:    Vitals:   05/25/20 0913  BP: 110/82  Pulse: 73  SpO2: 98%   General: Well Developed, well nourished, and in no acute distress.  Neuro/Psych: Alert and oriented x3, extra-ocular muscles intact, able to move all 4 extremities, sensation grossly intact. Skin: Warm  and dry, no rashes noted.  Respiratory: Not using accessory muscles, speaking in full sentences, trachea midline.  Cardiovascular: Pulses palpable, no extremity edema. Abdomen: Does not appear distended. MSK: C-spine normal-appearing nontender midline.  Tender palpation left cervical paraspinal musculature and trapezius. Decreased cervical motion to extension left rotation and left lateral flexion. Upper extremity strength reflexes and sensation are equal normal throughout.  Lab and Radiology Results X-ray cervical spine images obtained January 20, 2020 personally and independently interpreted today. 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   Impression and Recommendations:    Assessment and Plan: 54 y.o. female with left lateral neck pain with some radiculopathy.  Neck pain is left lateral mostly due to paraspinal muscle and trapezius spasm and dysfunction.  However she does have what appears to be some cervical radicular symptoms.  Based on her pain pattern of radiating pain to her fourth and fifth digit most likely C8.  However her x-ray shows DDD at C4-C5 which would indicate C5 is most likely.  This has been ongoing for over a year now and has had a evaluation and treatment with her PCP already with little benefit.  Based on prolonged course and her cancer history we will proceed to MRI as well as referral to physical therapy.  MRI indicated due to cancer history and long course of disease.  Consider epidural steroid injection for radicular symptoms if they still persist. We will also prescribe tizanidine for muscle spasm bed at bedtime.Marland Kitchen  PDMP not reviewed this encounter. Orders Placed This Encounter  Procedures  . MR CERVICAL SPINE WO  CONTRAST    Standing Status:   Future    Standing Expiration Date:   05/25/2021    Order Specific Question:   What is the patient's sedation requirement?    Answer:   No Sedation    Order Specific Question:   Does the patient have a pacemaker or  implanted devices?    Answer:   No    Order Specific Question:   Preferred imaging location?    Answer:   [facility] (table limit-350lbs)  . Ambulatory referral to Physical Therapy    Referral Priority:   Routine    Referral Type:   Physical Medicine    Referral Reason:   Specialty Services Required    Requested Specialty:   Physical Therapy    Number of Visits Requested:   1   Meds ordered this encounter  Medications  . tiZANidine (ZANAFLEX) 2 MG tablet    Sig: Take 1-2 tablets (2-4 mg total) by mouth every 6 (six) hours as needed for muscle spasms.    Dispense:  60 tablet    Refill:  1    Discussed warning signs or symptoms. Please see discharge instructions. Patient expresses understanding.   The above documentation has been reviewed and is accurate and complete Lynne Leader, M.D.

## 2020-05-25 NOTE — Patient Instructions (Addendum)
Thank you for coming in today.  I've referred you to Physical Therapy.  Let us know if you don't hear from them in one week.  Use the muscle relaxer as needed mostly at bedtime.  Use heating pad and TENS unit.   You should hear from MRI scheduling within 1 week. If you do not hear please let me know.   We may check back after MRI to go over results. This is not mandatory.

## 2020-06-05 ENCOUNTER — Other Ambulatory Visit: Payer: Self-pay | Admitting: Family Medicine

## 2020-06-09 ENCOUNTER — Other Ambulatory Visit: Payer: Self-pay | Admitting: Family Medicine

## 2020-06-13 ENCOUNTER — Other Ambulatory Visit: Payer: Self-pay | Admitting: Family Medicine

## 2020-06-29 ENCOUNTER — Other Ambulatory Visit: Payer: Self-pay | Admitting: Family

## 2020-06-29 DIAGNOSIS — I10 Essential (primary) hypertension: Secondary | ICD-10-CM

## 2020-09-22 ENCOUNTER — Other Ambulatory Visit: Payer: Self-pay | Admitting: Family

## 2020-09-22 DIAGNOSIS — I10 Essential (primary) hypertension: Secondary | ICD-10-CM

## 2020-12-21 ENCOUNTER — Other Ambulatory Visit: Payer: Self-pay | Admitting: Family

## 2020-12-21 DIAGNOSIS — I10 Essential (primary) hypertension: Secondary | ICD-10-CM

## 2021-03-08 ENCOUNTER — Other Ambulatory Visit: Payer: Self-pay | Admitting: Family

## 2021-03-08 DIAGNOSIS — I10 Essential (primary) hypertension: Secondary | ICD-10-CM

## 2021-06-08 IMAGING — DX DG CERVICAL SPINE COMPLETE 4+V
6 series · 6 of 6 positions shown · non-contrast
Comparison: None.

CLINICAL DATA: Posterior neck pain

EXAM:
CERVICAL SPINE - COMPLETE 4+ VIEW

[cervical spine ap]
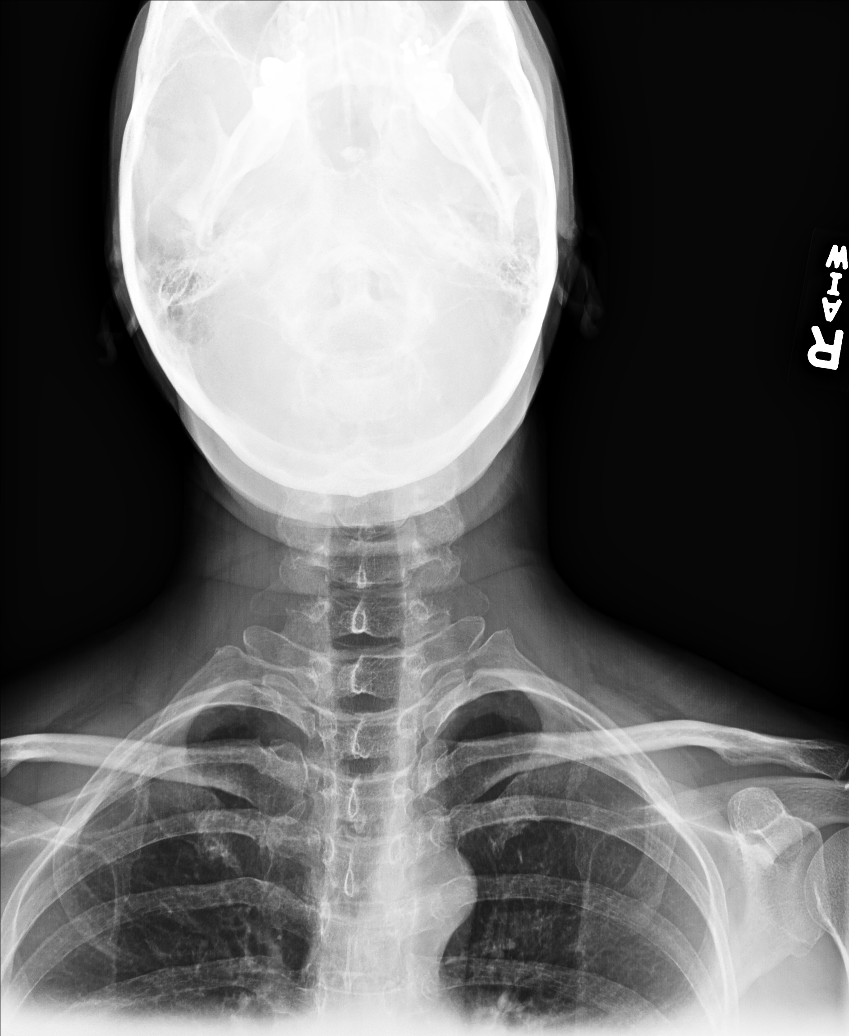

[cervical spine open mouth ap]
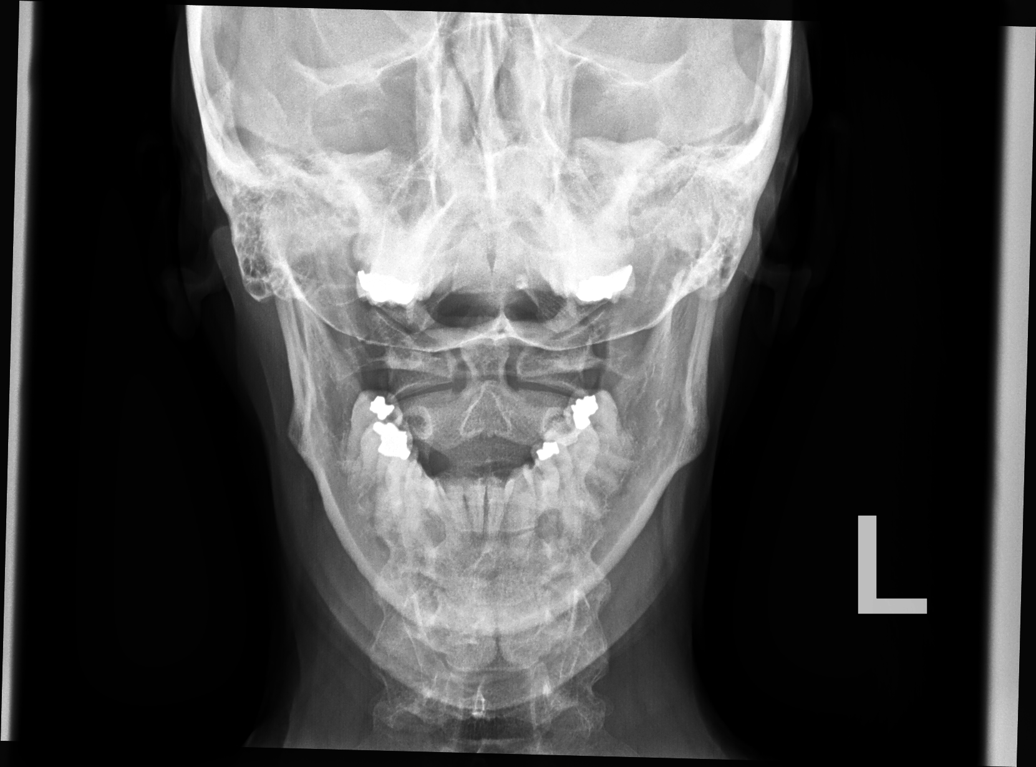

[cervical spine oblique (1 of 2)]
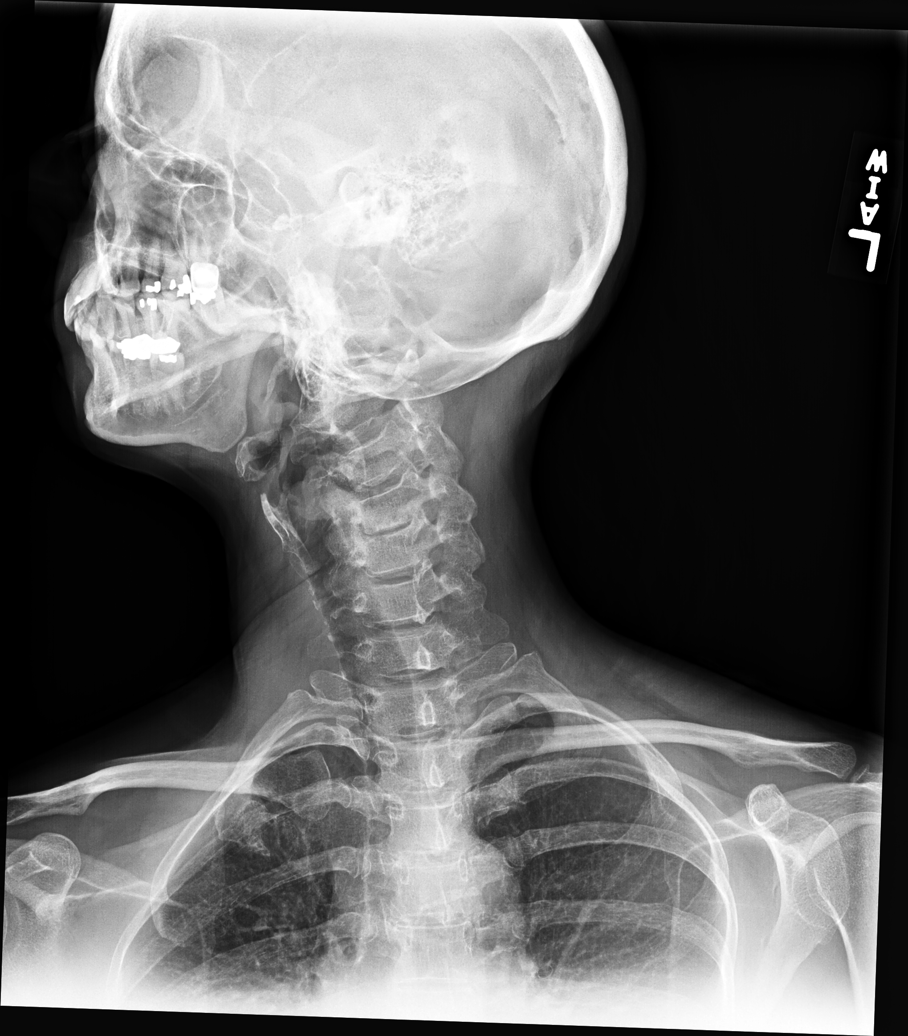

[cervical spine oblique (2 of 2)]
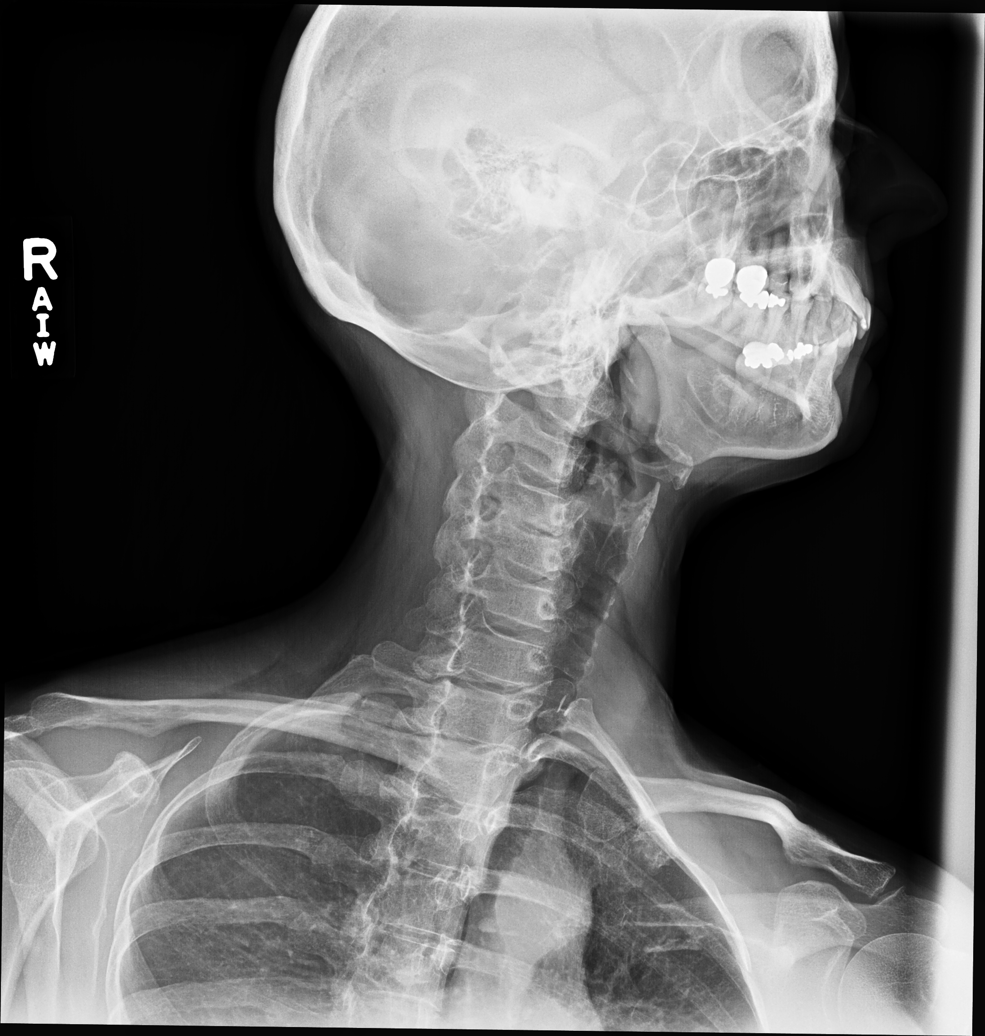

[cervical spine lat]
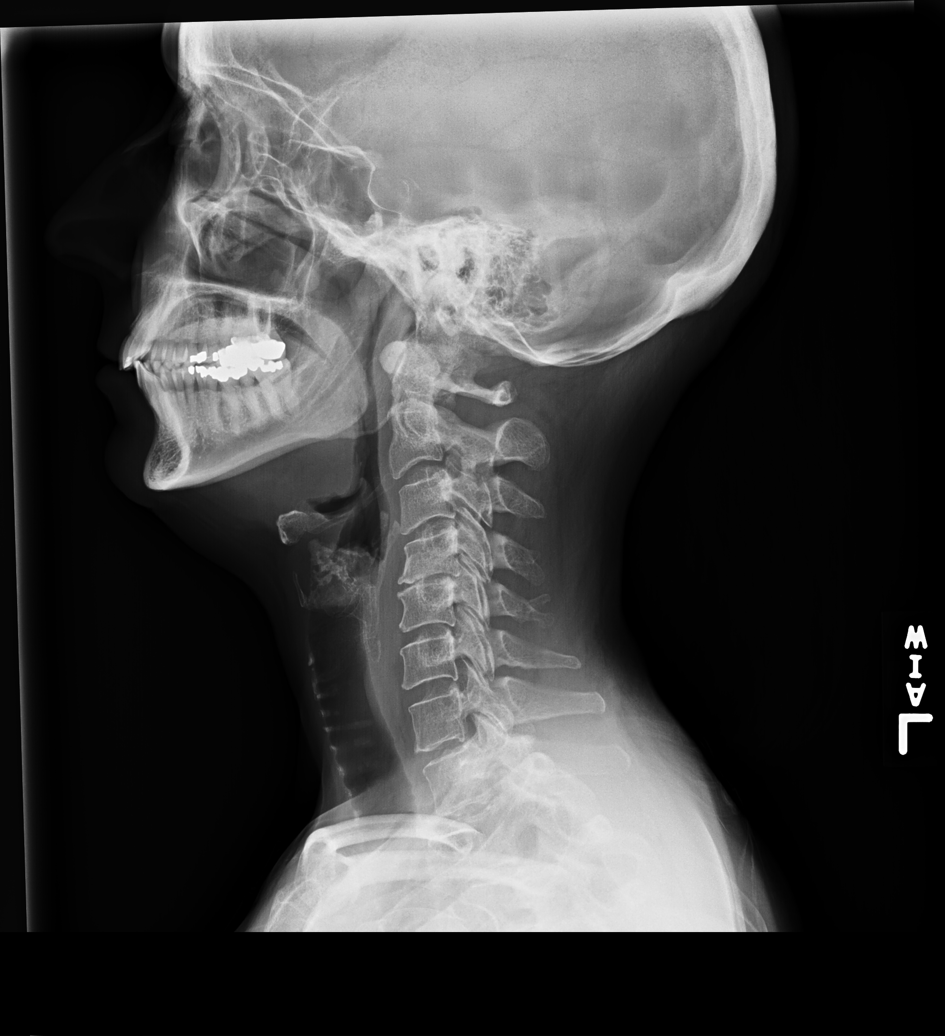

[swimmers lat]
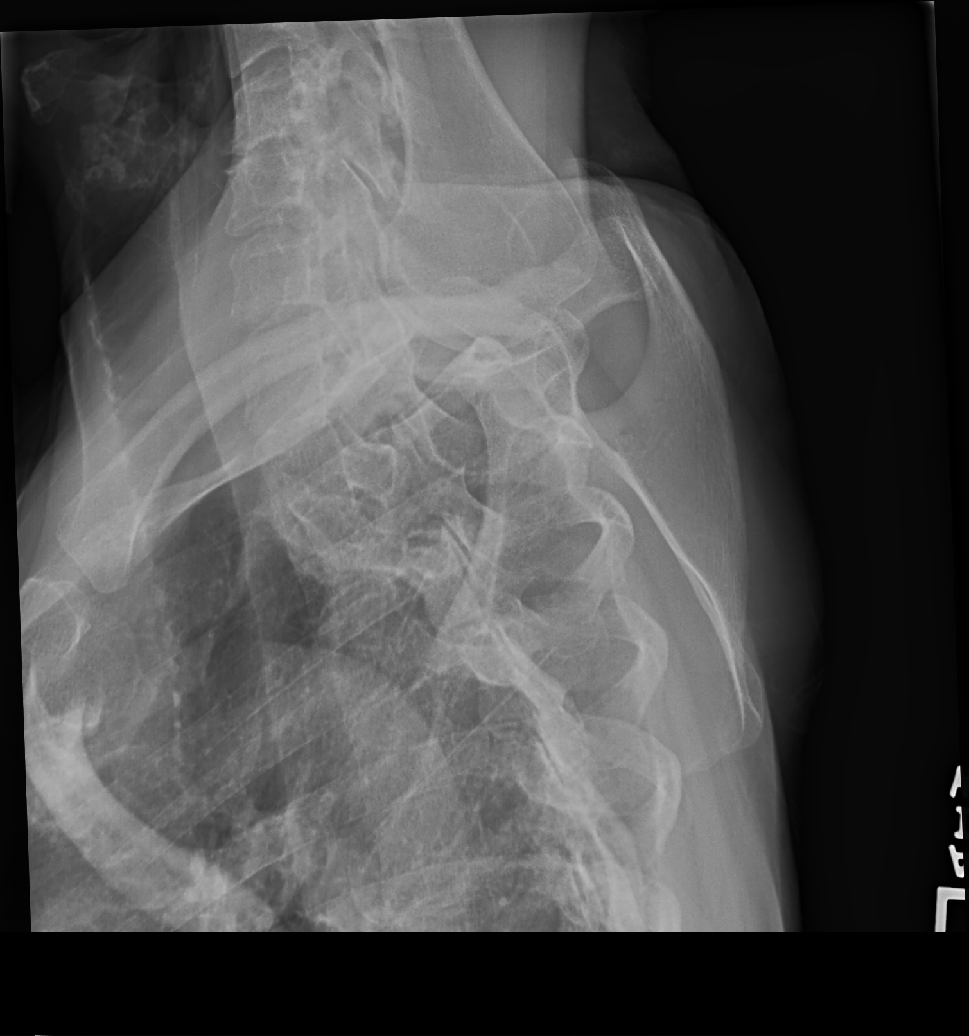

[6 of 6 positions shown; findings below may reference images not displayed]

FINDINGS: No fracture or static subluxation of the cervical spine.
Degenerative straightening of the normal cervical lordosis. Focally
moderate disc space height loss and osteophytosis of C4-C5 with
otherwise preserved disc spaces. There is no significant bony neural
foraminal stenosis. The partially imaged skull, cervical soft
tissues, and upper chest are unremarkable.
IMPRESSION: No fracture or static subluxation of the cervical spine. Focally
moderate disc space height loss and osteophytosis of C4-C5 with
otherwise preserved disc spaces.

## 2021-06-08 IMAGING — DX DG CHEST 2V
2 series · 2 of 2 positions shown · non-contrast
Comparison: None

CLINICAL DATA: Chest tightness

EXAM:
CHEST - 2 VIEW

[chest pa]
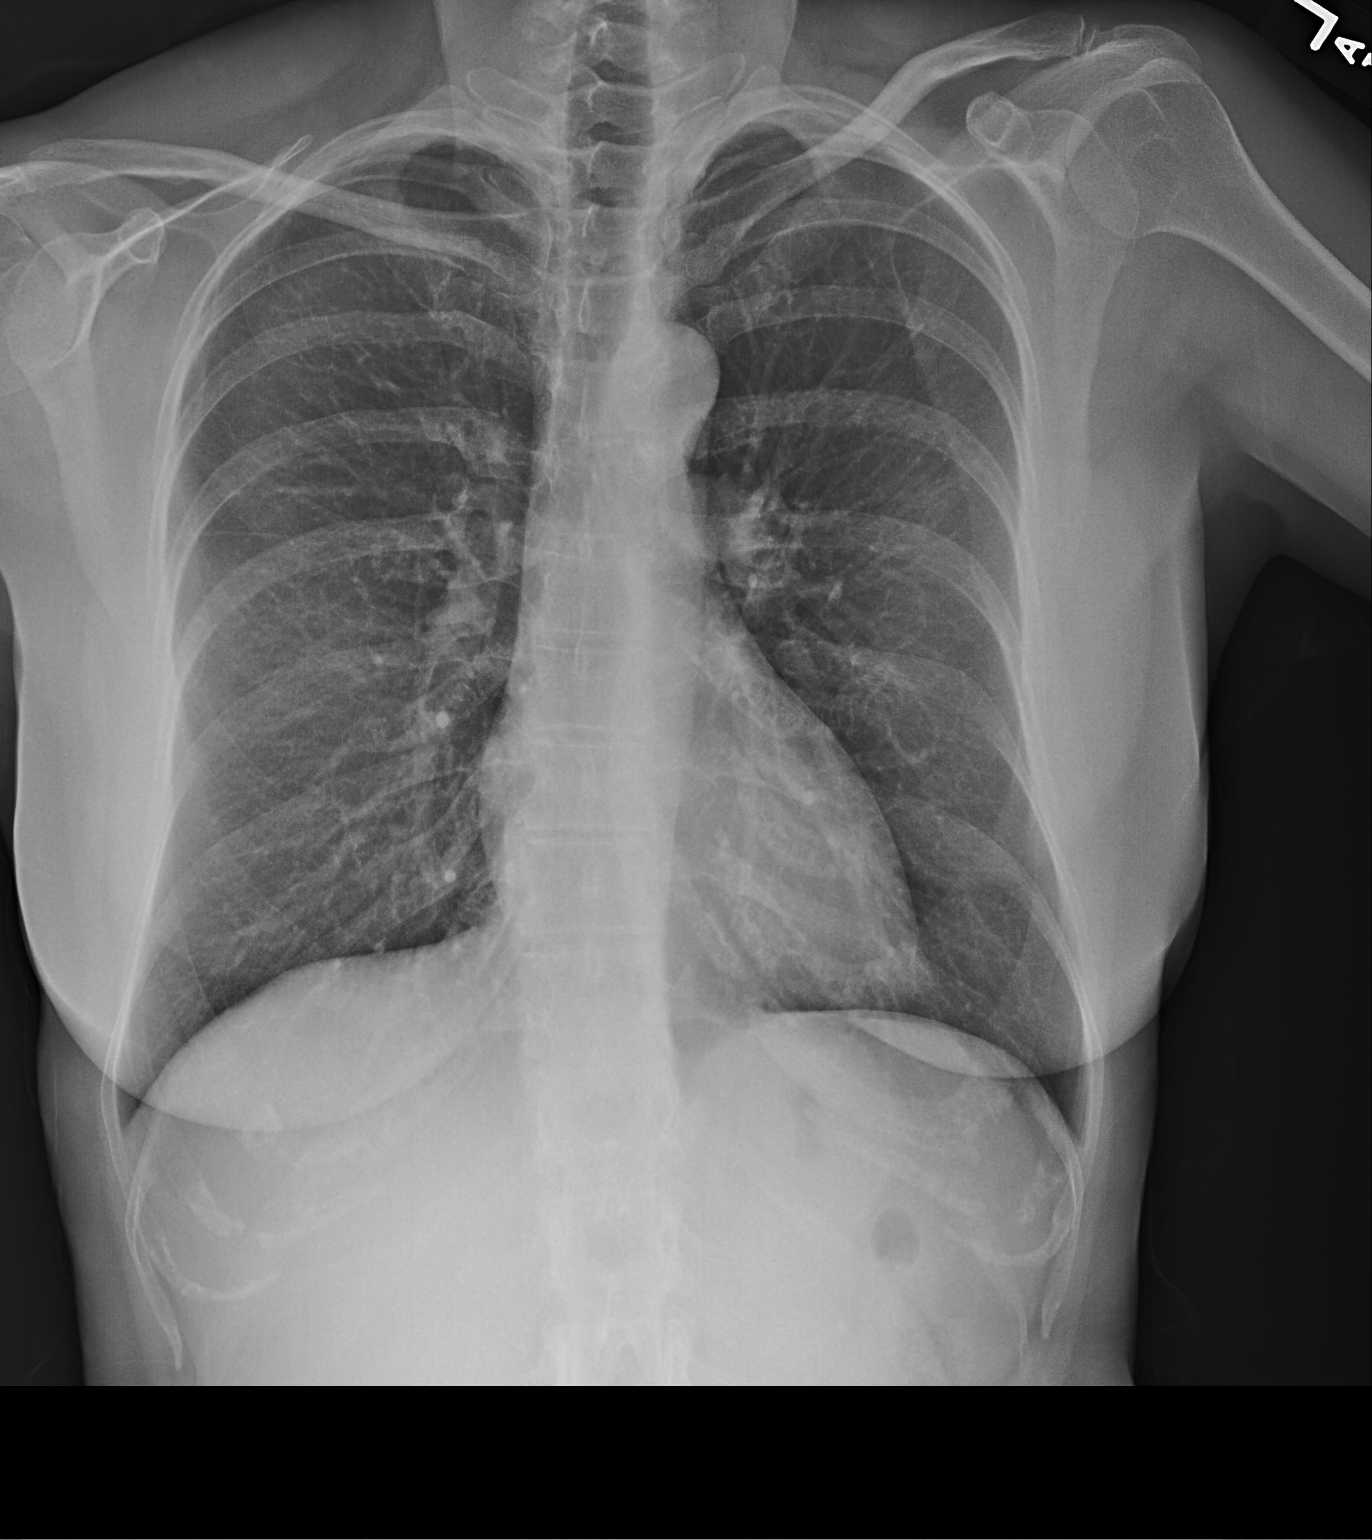

[chest lat]
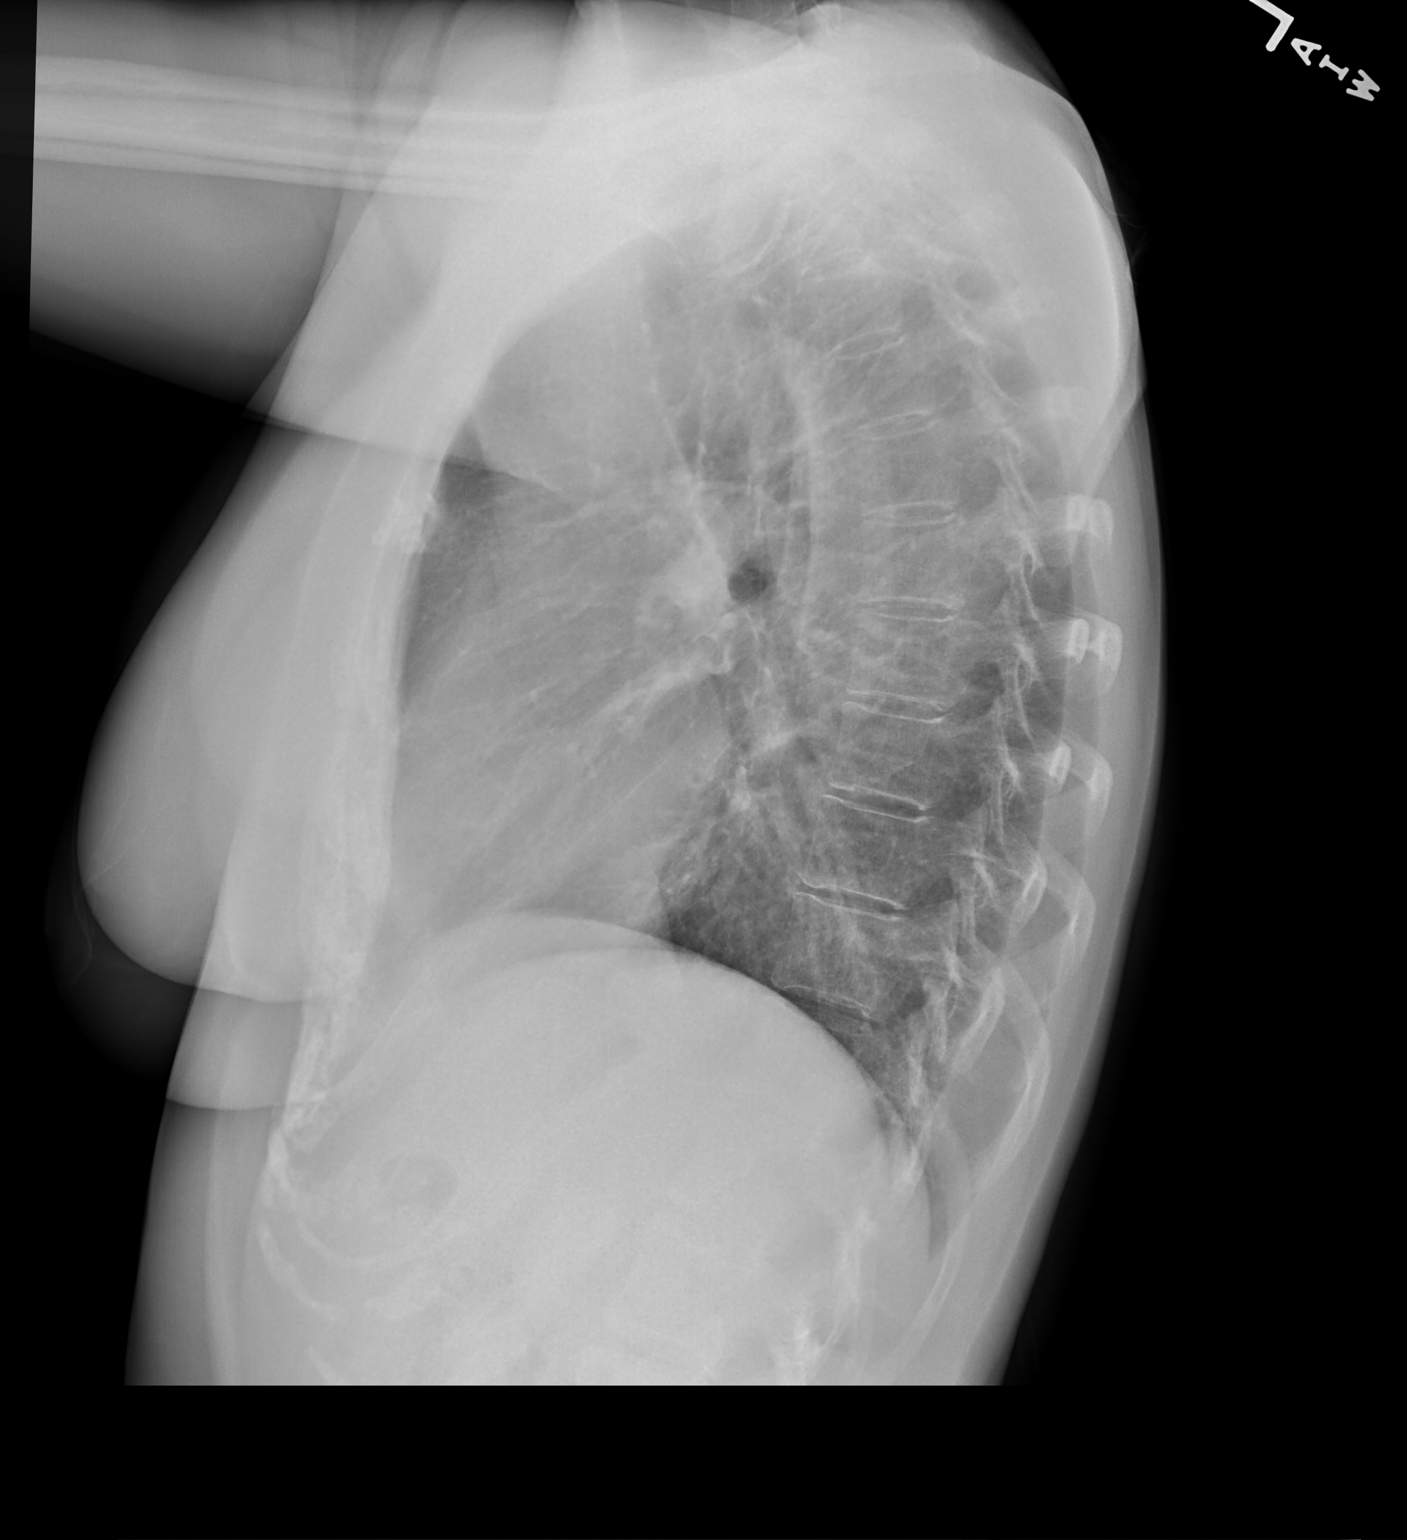

[2 of 2 positions shown; findings below may reference images not displayed]

FINDINGS: Cardiomediastinal contours and hilar structures are normal.

Lungs are clear.  No evidence of pleural effusion.

Visualized skeletal structures on limited assessment are
unremarkable.
IMPRESSION: No acute cardiopulmonary disease.

## 2021-06-29 ENCOUNTER — Ambulatory Visit: Payer: BC Managed Care – PPO | Admitting: Adult Health

## 2021-06-29 ENCOUNTER — Other Ambulatory Visit: Payer: Self-pay

## 2021-06-29 ENCOUNTER — Encounter: Payer: Self-pay | Admitting: Adult Health

## 2021-06-29 VITALS — BP 126/88 | HR 76 | Temp 96.6°F | Ht 62.01 in | Wt 119.6 lb

## 2021-06-29 DIAGNOSIS — I10 Essential (primary) hypertension: Secondary | ICD-10-CM

## 2021-06-29 DIAGNOSIS — Z8542 Personal history of malignant neoplasm of other parts of uterus: Secondary | ICD-10-CM

## 2021-06-29 DIAGNOSIS — Z1509 Genetic susceptibility to other malignant neoplasm: Secondary | ICD-10-CM | POA: Diagnosis not present

## 2021-06-29 MED ORDER — AMLODIPINE BESYLATE 2.5 MG PO TABS: 2.5000 mg | ORAL_TABLET | Freq: Every day | ORAL | 0 refills | Status: DC

## 2021-06-29 NOTE — Patient Instructions (Signed)
Hypertension, Adult High blood pressure (hypertension) is when the force of blood pumping through the arteries is too strong. The arteries are the blood vessels that carry blood from the heart throughout the body. Hypertension forces the heart to work harder to pump blood and may cause arteries to become narrow or stiff. Untreated or uncontrolled hypertension can cause a heart attack, heart failure, a stroke, kidney disease, and other problems. A blood pressure reading consists of a higher number over a lower number. Ideally, your blood pressure should be below 120/80. The first ("top") number is called the systolic pressure. It is a measure of the pressure in your arteries as your heart beats. The second ("bottom") number is called the diastolic pressure. It is a measure of the pressure in your arteries as the heart relaxes. What are the causes? The exact cause of this condition is not known. There are some conditions that result in or are related to high blood pressure. What increases the risk? Some risk factors for high blood pressure are under your control. The following factors may make you more likely to develop this condition: Smoking. Having type 2 diabetes mellitus, high cholesterol, or both. Not getting enough exercise or physical activity. Being overweight. Having too much fat, sugar, calories, or salt (sodium) in your diet. Drinking too much alcohol. Some risk factors for high blood pressure may be difficult or impossible to change. Some of these factors include: Having chronic kidney disease. Having a family history of high blood pressure. Age. Risk increases with age. Race. You may be at higher risk if you are African American. Gender. Men are at higher risk than women before age 45. After age 65, women are at higher risk than men. Having obstructive sleep apnea. Stress. What are the signs or symptoms? High blood pressure may not cause symptoms. Very high blood pressure  (hypertensive crisis) may cause: Headache. Anxiety. Shortness of breath. Nosebleed. Nausea and vomiting. Vision changes. Severe chest pain. Seizures. How is this diagnosed? This condition is diagnosed by measuring your blood pressure while you are seated, with your arm resting on a flat surface, your legs uncrossed, and your feet flat on the floor. The cuff of the blood pressure monitor will be placed directly against the skin of your upper arm at the level of your heart. It should be measured at least twice using the same arm. Certain conditions can cause a difference in blood pressure between your right and left arms. Certain factors can cause blood pressure readings to be lower or higher than normal for a short period of time: When your blood pressure is higher when you are in a health care provider's office than when you are at home, this is called white coat hypertension. Most people with this condition do not need medicines. When your blood pressure is higher at home than when you are in a health care provider's office, this is called masked hypertension. Most people with this condition may need medicines to control blood pressure. If you have a high blood pressure reading during one visit or you have normal blood pressure with other risk factors, you may be asked to: Return on a different day to have your blood pressure checked again. Monitor your blood pressure at home for 1 week or longer. If you are diagnosed with hypertension, you may have other blood or imaging tests to help your health care provider understand your overall risk for other conditions. How is this treated? This condition is treated by making   healthy lifestyle changes, such as eating healthy foods, exercising more, and reducing your alcohol intake. Your health care provider may prescribe medicine if lifestyle changes are not enough to get your blood pressure under control, and if: Your systolic blood pressure is above  130. Your diastolic blood pressure is above 80. Your personal target blood pressure may vary depending on your medical conditions, your age, and other factors. Follow these instructions at home: Eating and drinking  Eat a diet that is high in fiber and potassium, and low in sodium, added sugar, and fat. An example eating plan is called the DASH (Dietary Approaches to Stop Hypertension) diet. To eat this way: Eat plenty of fresh fruits and vegetables. Try to fill one half of your plate at each meal with fruits and vegetables. Eat whole grains, such as whole-wheat pasta, brown rice, or whole-grain bread. Fill about one fourth of your plate with whole grains. Eat or drink low-fat dairy products, such as skim milk or low-fat yogurt. Avoid fatty cuts of meat, processed or cured meats, and poultry with skin. Fill about one fourth of your plate with lean proteins, such as fish, chicken without skin, beans, eggs, or tofu. Avoid pre-made and processed foods. These tend to be higher in sodium, added sugar, and fat. Reduce your daily sodium intake. Most people with hypertension should eat less than 1,500 mg of sodium a day. Do not drink alcohol if: Your health care provider tells you not to drink. You are pregnant, may be pregnant, or are planning to become pregnant. If you drink alcohol: Limit how much you use to: 0-1 drink a day for women. 0-2 drinks a day for men. Be aware of how much alcohol is in your drink. In the [place] one drink equals one 12 oz bottle of beer (355 mL), one 5 oz glass of wine (148 mL), or one 1 oz glass of hard liquor (44 mL). Lifestyle  Work with your health care provider to maintain a healthy body weight or to lose weight. Ask what an ideal weight is for you. Get at least 30 minutes of exercise most days of the week. Activities may include walking, swimming, or biking. Include exercise to strengthen your muscles (resistance exercise), such as Pilates or lifting weights, as  part of your weekly exercise routine. Try to do these types of exercises for 30 minutes at least 3 days a week. Do not use any products that contain nicotine or tobacco, such as cigarettes, e-cigarettes, and chewing tobacco. If you need help quitting, ask your health care provider. Monitor your blood pressure at home as told by your health care provider. Keep all follow-up visits as told by your health care provider. This is important. Medicines Take over-the-counter and prescription medicines only as told by your health care provider. Follow directions carefully. Blood pressure medicines must be taken as prescribed. Do not skip doses of blood pressure medicine. Doing this puts you at risk for problems and can make the medicine less effective. Ask your health care provider about side effects or reactions to medicines that you should watch for. Contact a health care provider if you: Think you are having a reaction to a medicine you are taking. Have headaches that keep coming back (recurring). Feel dizzy. Have swelling in your ankles. Have trouble with your vision. Get help right away if you: Develop a severe headache or confusion. Have unusual weakness or numbness. Feel faint. Have severe pain in your chest or abdomen. Vomit repeatedly. Have trouble   breathing. Summary Hypertension is when the force of blood pumping through your arteries is too strong. If this condition is not controlled, it may put you at risk for serious complications. Your personal target blood pressure may vary depending on your medical conditions, your age, and other factors. For most people, a normal blood pressure is less than 120/80. Hypertension is treated with lifestyle changes, medicines, or a combination of both. Lifestyle changes include losing weight, eating a healthy, low-sodium diet, exercising more, and limiting alcohol. This information is not intended to replace advice given to you by your health care  provider. Make sure you discuss any questions you have with your health care provider. Document Revised: 04/10/2018 Document Reviewed: 04/10/2018 Elsevier Patient Education  2022 [place]. Amlodipine Tablets What is this medication? AMLODIPINE (am LOE di peen) treats high blood pressure and prevents chest pain (angina). It works by relaxing the blood vessels, which helps decrease the amount of work your heart has to do. It belongs to a group of medications called calcium channel blockers. This medicine may be used for other purposes; ask your health care provider or pharmacist if you have questions. COMMON BRAND NAME(S): Norvasc What should I tell my care team before I take this medication? They need to know if you have any of these conditions: Heart disease Liver disease An unusual or allergic reaction to amlodipine, other medications, foods, dyes, or preservatives Pregnant or trying to get pregnant Breast-feeding How should I use this medication? Take this medication by mouth. Take it as directed on the prescription label at the same time every day. You can take it with or without food. If it upsets your stomach, take it with food. Keep taking it unless your care team tells you to stop. Talk to your care team about the use of this medication in children. While it may be prescribed for children as young as 6 for selected conditions, precautions do apply. Overdosage: If you think you have taken too much of this medicine contact a poison control center or emergency room at once. NOTE: This medicine is only for you. Do not share this medicine with others. What if I miss a dose? If you miss a dose, take it as soon as you can. If it is almost time for your next dose, take only that dose. Do not take double or extra doses. What may interact with this medication? Clarithromycin Cyclosporine Diltiazem Itraconazole Simvastatin Tacrolimus This list may not describe all possible interactions.  Give your health care provider a list of all the medicines, herbs, non-prescription drugs, or dietary supplements you use. Also tell them if you smoke, drink alcohol, or use illegal drugs. Some items may interact with your medicine. What should I watch for while using this medication? Visit your health care provider for regular checks on your progress. Check your blood pressure as directed. Ask your health care provider what your blood pressure should be. Also, find out when you should contact him or her. Do not treat yourself for coughs, colds, or pain while you are using this medication without asking your health care provider for advice. Some medications may increase your blood pressure. You may get drowsy or dizzy. Do not drive, use machinery, or do anything that needs mental alertness until you know how this medication affects you. Do not stand up or sit up quickly, especially if you are an older patient. This reduces the risk of dizzy or fainting spells. Alcohol can make you more drowsy and dizzy.  Avoid alcoholic drinks. What side effects may I notice from receiving this medication? Side effects that you should report to your care team as soon as possible: Allergic reactions--skin rash, itching, hives, swelling of the face, lips, tongue, or throat Heart attack--pain or tightness in the chest, shoulders, arms, or jaw, nausea, shortness of breath, cold or clammy skin, feeling faint or lightheaded Low blood pressure--dizziness, feeling faint or lightheaded, blurry vision Side effects that usually do not require medical attention (report these to your care team if they continue or are bothersome): Facial flushing, redness Heart palpitations--rapid, pounding, or irregular heartbeat Nausea Stomach pain Swelling of the ankles, hands, or feet This list may not describe all possible side effects. Call your doctor for medical advice about side effects. You may report side effects to FDA at  1-800-FDA-1088. Where should I keep my medication? Keep out of the reach of children and pets. Store at room temperature between 20 and 25 degrees C (68 and 77 degrees F). Protect from light and moisture. Keep the container tightly closed. Get rid of any unused medication after the expiration date. To get rid of medications that are no longer needed or have expired: Take the medication to a medication take-back program. Check with your pharmacy or law enforcement to find a location. If you cannot return the medication, check the label or package insert to see if the medication should be thrown out in the garbage or flushed down the toilet. If you are not sure, ask your health care provider. If it is safe to put in the trash, empty the medication out of the container. Mix the medication with cat litter, dirt, coffee grounds, or other unwanted substance. Seal the mixture in a bag or container. Put it in the trash. NOTE: This sheet is a summary. It may not cover all possible information. If you have questions about this medicine, talk to your doctor, pharmacist, or health care provider.  2022 Elsevier/[employer] Standard (2021-04-19 00:00:00)

## 2021-06-29 NOTE — Progress Notes (Signed)
Acute Office Visit  Subjective:    Patient ID: Katherine Sharp, female    DOB: 1966/02/15, 55 y.o.   MRN: [id number]  Chief Complaint  Patient presents with   Hypertension   Headache    Hypertension Associated symptoms include headaches.  Headache  Associated symptoms include tinnitus (last sunday with headache described as mild ringing and no other associated symtoms, headache and tinnitus resolved Sunday.). Pertinent negatives include no dizziness, ear pain, hearing loss, numbness, rhinorrhea, seizures, sinus pressure, sore throat or weakness. Her past medical history is significant for hypertension.  Patient is in today for elevated blood pressure readings 124/93. 158/102, 129/96 higher in the past week but have been high in the past.  She had a headache on Sunday and checked her blood pressure and it was 158/102 and had mild  ringing in her ears that has now resolved lasted most of the day Sunday without any other associated symptoms. She denies any other symptoms. Denies any thunderclap or worse headache of her life. Denies any numbness, tingling, speech changes, or edema.  She is currently taking lisinopril every day at 10 mg daily and takes 5mg  if bp over 130/90.  She also takes HCTZ 12.5 mg.She denies missing any doses of her medication.   She is on Adderal 15mg , she sees Dr. Toy Care for ADHD, and she was on 30 mg previously. 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   She has taken the Lisinopril 5mg  PRN today and last week on Sunday when her blood pressure was high and her symptoms did resolve.   Caffeine 1 cup per day.   Last visit in office was video 04/30/2020 when she had covid.  Has Lynch syndrome, needs urinalysis today.  Patient  denies any fever, body aches,chills, rash, chest pain, shortness of breath,  nausea, vomiting, or diarrhea. Denies any jaw pain, neck pain.  Has no other concerns at today's viist.   Past Medical History:  Diagnosis Date   ADHD (attention deficit hyperactivity disorder)    Asthma    per pt mild , does not have inhaler   Depression    History of migraine    History of ovarian cyst    2001 s/p RSO for hemorrhagic cyst   Hypertension    Wears glasses     Past Surgical History:  Procedure Laterality Date   APPENDECTOMY  1984   BREAST ENHANCEMENT SURGERY Bilateral 1991   COLONOSCOPY WITH PROPOFOL N/A 10/02/2016   Procedure: COLONOSCOPY WITH PROPOFOL;  Surgeon: Manya Silvas, MD;  Location: [facility];  Service: Endoscopy;  Laterality: N/A;   DILATATION & CURETTAGE/HYSTEROSCOPY WITH MYOSURE N/A 07/01/2019   Procedure: DILATATION & CURETTAGE/HYSTEROSCOPY WITH MYOSURE;  Surgeon: Brien Few, MD;  Location: [facility];  Service: Gynecology;  Laterality: N/A;   LAPAROSCOPIC HYSTERECTOMY  08/05/2019   LAPAROSCOPIC SALPINGOOPHERECTOMY Right 2001   for hemorrhagic cyst   TONSILLECTOMY AND ADENOIDECTOMY  1970    Family History  Problem Relation Age of Onset   Early death Mother        Brain Tumor 84 yo   Cancer Maternal Grandmother        Uterus, Bladder, Lung and Colon   Uterine cancer Maternal Grandmother        lived until 40 years old   Colon cancer  Maternal Grandmother    Bladder Cancer Maternal Grandmother    Breast cancer Maternal Grandmother     Social History   Socioeconomic History   Marital status: Married    Spouse name: Not on file   Number of children: Not on file   Years of education: Not on file   Highest education level: Not on file  Occupational History   Not on file  Tobacco Use   Smoking status: Never   Smokeless tobacco: Never  Vaping Use   Vaping Use: Never used  Substance and Sexual Activity   Alcohol use: Yes    Comment: rare   Drug use: Never   Sexual activity: Yes    Partners: Male    Birth  control/protection: Post-menopausal, Other-see comments    Comment: Vasectomy  Other Topics Concern   Not on file  Social History Narrative   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    Pets: 3 cats inside and 1 dog inside    Teacher [employer]   Social Determinants of Health   Financial Resource Strain: Not on file  Food Insecurity: Not on file  Transportation Needs: Not on file  Physical Activity: Not on file  Stress: Not on file  Social Connections: Not on file  Intimate Partner Violence: Not on file    Outpatient Medications Prior to Visit  Medication Sig Dispense Refill   albuterol (VENTOLIN HFA) 108 (90 Base) MCG/ACT inhaler Inhale 2 puffs into the lungs every 6 (six) hours as needed for wheezing or shortness of breath. 6.7 g 1   ascorbic acid (VITAMIN C) 100 MG tablet Take 100 mg by mouth daily.     calcium carbonate (TUMS - DOSED IN MG ELEMENTAL CALCIUM) 500 MG chewable tablet Chew 1 tablet by mouth as needed for indigestion or heartburn.     diclofenac Sodium (VOLTAREN) 1 % GEL Apply 4 g topically 4 (four) times daily. 50 g 1   escitalopram (LEXAPRO) 20 MG tablet Take 1 tablet by mouth daily.     estradiol (ESTRACE) 0.1 MG/GM vaginal cream Place 1 g vaginally daily.      gabapentin (NEURONTIN) 100 MG capsule Take 2 capsules (200 mg total) by mouth at bedtime. 120 capsule 3   hydrochlorothiazide (MICROZIDE) 12.5 MG capsule TAKE 1 CAPSULE(12.5 MG) BY MOUTH DAILY 90 capsule 0   lisinopril (ZESTRIL) 10 MG tablet TAKE 1 TABLET(10 MG) BY MOUTH DAILY 90 tablet 3   Multiple Vitamins-Minerals (MULTIVITAMIN ADULT PO) Take by mouth daily.     pantoprazole (PROTONIX) 40 MG tablet Take 40 mg by mouth daily.     tiZANidine (ZANAFLEX) 2 MG tablet TAKE 1 TO 2 TABLETS(2 TO 4 MG) BY MOUTH EVERY 6 HOURS AS NEEDED FOR MUSCLE SPASMS 60 tablet 1   lisinopril (ZESTRIL) 5 MG tablet Take 1  tablet (5 mg total) by mouth daily as needed. If BP > 130/90 90 tablet 3   ALPRAZolam (XANAX) 0.25 MG tablet Take 0.25 mg by mouth 2 (two) times daily as needed.     amphetamine-dextroamphetamine (ADDERALL XR) 15 MG 24 hr capsule Take by mouth every morning.     hydrocortisone 2.5 % lotion Apply topically 2 (two) times daily as needed.     zinc gluconate 50 MG tablet Take 50 mg by mouth daily. (Patient not taking: Reported on 06/29/2021)     No facility-administered  medications prior to visit.    Allergies  Allergen Reactions   Nsaids Nausea Only    Abdominal pains and indigestion    Review of Systems  Constitutional: Negative.   HENT:  Positive for tinnitus (last sunday with headache described as mild ringing and no other associated symtoms, headache and tinnitus resolved Sunday.). Negative for congestion, dental problem, drooling, ear discharge, ear pain, facial swelling, hearing loss, mouth sores, nosebleeds, postnasal drip, rhinorrhea, sinus pressure, sinus pain, sneezing, sore throat, trouble swallowing and voice change.   Eyes: Negative.  Negative for visual disturbance.  Respiratory: Negative.    Cardiovascular: Negative.   Gastrointestinal:  Positive for abdominal distention.  Genitourinary: Negative.   Musculoskeletal: Negative.   Neurological:  Positive for headaches. Negative for dizziness, tremors, seizures, syncope, facial asymmetry, speech difficulty, weakness, light-headedness and numbness.  Psychiatric/Behavioral:         Adhd treated by psychiatry       Objective:    Physical Exam Constitutional:      General: She is not in acute distress.    Appearance: She is well-developed. She is not ill-appearing, toxic-appearing or diaphoretic.  HENT:     Head: Normocephalic and atraumatic.     Mouth/Throat:     Mouth: Mucous membranes are moist.  Eyes:     General: No visual field deficit or scleral icterus.    Extraocular Movements: Extraocular movements intact.      Right eye: Normal extraocular motion and no nystagmus.     Left eye: Normal extraocular motion and no nystagmus.     Pupils: Pupils are equal.     Right eye: Pupil is round and reactive.     Left eye: Pupil is round and reactive.  Cardiovascular:     Rate and Rhythm: Normal rate and regular rhythm.     Chest Wall: PMI is not displaced.     Pulses:          Dorsalis pedis pulses are 2+ on the right side and 2+ on the left side.     Heart sounds: Normal heart sounds. No murmur heard.   No friction rub. No gallop.  Pulmonary:     Effort: Pulmonary effort is normal. No respiratory distress.     Breath sounds: Normal breath sounds. No stridor. No wheezing, rhonchi or rales.  Chest:     Chest wall: No tenderness.  Abdominal:     General: Bowel sounds are normal. There is no distension.     Palpations: Abdomen is soft.     Tenderness: There is no abdominal tenderness. There is no guarding.  Musculoskeletal:        General: No swelling or tenderness. Normal range of motion.     Cervical back: Normal range of motion and neck supple. No rigidity.     Right lower leg: No edema.     Left lower leg: No edema.  Lymphadenopathy:     Cervical: No cervical adenopathy.  Skin:    General: Skin is warm and dry.     Capillary Refill: Capillary refill takes less than 2 seconds.  Neurological:     Mental Status: She is alert and oriented to person, place, and time.     GCS: GCS eye subscore is 4. GCS verbal subscore is 5. GCS motor subscore is 6.     Cranial Nerves: No cranial nerve deficit, dysarthria or facial asymmetry.     Sensory: No sensory deficit.     Motor: No weakness.  Coordination: Romberg sign negative. Coordination normal.     Gait: Gait normal.     Comments: Neurologic exam:  Speech clear, pupils equal round reactive to light, extraocular movements intact  Normal peripheral visual fields Cranial nerves III through XII normal including no facial droop Follows commands, moves all  extremities x4, normal strength to bilateral upper and lower extremities at all major muscle groups including grip Sensation normal to light touch and pinprick Coordination intact, no limb ataxia, finger-nose-finger normal, heel shin normal bilaterally Rapid alternating movements normal No pronator drift Gait normal Can heal and toe walk without weakness.    Psychiatric:        Mood and Affect: Mood normal.        Speech: Speech normal.        Behavior: Behavior normal.    BP 126/88   Pulse 76   Temp (!) 96.6 F (35.9 C)   Ht 5' 2.01" (1.575 m)   Wt 119 lb 9.6 oz (54.3 kg)   LMP 07/23/2016   SpO2 95%   BMI 21.87 kg/m  Wt Readings from Last 3 Encounters:  06/29/21 119 lb 9.6 oz (54.3 kg)  05/25/20 120 lb (54.4 kg)  04/30/20 116 lb (52.6 kg)    Health Maintenance Due  Topic Date Due   Pneumococcal Vaccine 24-32 Years old (1 - PCV) Never done   Hepatitis C Screening  Never done   TETANUS/TDAP  Never done   Zoster Vaccines- Shingrix (1 of 2) Never done   COLONOSCOPY (Pts 45-47yrs Insurance coverage will need to be confirmed)  10/02/2017   MAMMOGRAM  02/12/2019   COVID-19 Vaccine (3 - Moderna risk series) 12/01/2019   PAP SMEAR-Modifier  02/12/2020   INFLUENZA VACCINE  03/14/2021    There are no preventive care reminders to display for this patient.   Lab Results  Component Value Date   TSH 1.04 01/28/2020   Lab Results  Component Value Date   WBC 4.2 03/22/2020   HGB 12.9 03/22/2020   HCT 37.7 03/22/2020   MCV 85.0 03/22/2020   PLT 223.0 03/22/2020   Lab Results  Component Value Date   NA 140 01/28/2020   NA 140 01/28/2020   K 4.1 01/28/2020   K 4.1 01/28/2020   CO2 31 01/28/2020   CO2 31 01/28/2020   GLUCOSE 105 (H) 01/28/2020   GLUCOSE 105 (H) 01/28/2020   BUN 16 01/28/2020   BUN 16 01/28/2020   CREATININE 0.72 01/28/2020   CREATININE 0.72 01/28/2020   BILITOT 0.4 01/28/2020   ALKPHOS 53 01/28/2020   AST 24 01/28/2020   ALT 23 01/28/2020    PROT 6.6 01/28/2020   ALBUMIN 4.6 01/28/2020   CALCIUM 9.1 01/28/2020   CALCIUM 9.1 01/28/2020   ANIONGAP 9 06/27/2019   GFR 84.31 01/28/2020   GFR 84.31 01/28/2020   Lab Results  Component Value Date   CHOL 191 01/28/2020   Lab Results  Component Value Date   HDL 79.80 01/28/2020   Lab Results  Component Value Date   LDLCALC 94 01/28/2020   Lab Results  Component Value Date   TRIG 85.0 01/28/2020   Lab Results  Component Value Date   CHOLHDL 2 01/28/2020   Lab Results  Component Value Date   HGBA1C 5.5 01/28/2020       Assessment & Plan:   Problem List Items Addressed This Visit       Cardiovascular and Mediastinum   Hypertension - Primary   Relevant Medications  amLODipine (NORVASC) 2.5 MG tablet   Other Relevant Orders   CBC with Differential/Platelet   TSH   Comprehensive metabolic panel     Other   Lynch syndrome   Relevant Orders   Urinalysis, microscopic only  Continue lisinopril 10 mg, and HCTZ as prescribed. Will hold the lisinopril 5 mg PRN dosage for now.  Will add norvasc as below. Discussed possible side effects.labs today.  Meds ordered this encounter  Medications   amLODipine (NORVASC) 2.5 MG tablet    Sig: Take 1 tablet (2.5 mg total) by mouth daily.    Dispense:  30 tablet    Refill:  0   Medications Discontinued During This Encounter  Medication Reason   lisinopril (ZESTRIL) 5 MG tablet Completed Course     Discussed red flags with headache and signs of stroke and when to seek care immediately. Low sodium diet advised. Keep log of blood pressures. Do not hesitate to seek emergency care if symptoms return at anytime. Blood pressure parameters discussed.   Need to follow for regular health maintenance with PCP Return in about 3 weeks (around 07/20/2021), or if symptoms worsen or fail to improve, for at any time for any worsening symptoms, Go to Emergency room/ urgent care if worse.   Marcille Buffy, FNP

## 2021-06-30 ENCOUNTER — Telehealth: Payer: Self-pay

## 2021-06-30 ENCOUNTER — Encounter: Payer: Self-pay | Admitting: Adult Health

## 2021-06-30 DIAGNOSIS — R399 Unspecified symptoms and signs involving the genitourinary system: Secondary | ICD-10-CM

## 2021-06-30 DIAGNOSIS — Z1509 Genetic susceptibility to other malignant neoplasm: Secondary | ICD-10-CM

## 2021-06-30 DIAGNOSIS — R7989 Other specified abnormal findings of blood chemistry: Secondary | ICD-10-CM

## 2021-06-30 LAB — COMPREHENSIVE METABOLIC PANEL
ALT: 31 U/L (ref 0–35)
AST: 29 U/L (ref 0–37)
Albumin: 5 g/dL (ref 3.5–5.2)
Alkaline Phosphatase: 50 U/L (ref 39–117)
BUN: 15 mg/dL (ref 6–23)
CO2: 29 mEq/L (ref 19–32)
Calcium: 10.1 mg/dL (ref 8.4–10.5)
Chloride: 101 mEq/L (ref 96–112)
Creatinine, Ser: 0.74 mg/dL (ref 0.40–1.20)
GFR: 90.88 mL/min (ref >60.00)
Glucose, Bld: 86 mg/dL (ref 70–99)
Potassium: 3.9 mEq/L (ref 3.5–5.1)
Sodium: 140 mEq/L (ref 135–145)
Total Bilirubin: 0.6 mg/dL (ref 0.2–1.2)
Total Protein: 7.3 g/dL (ref 6.0–8.3)

## 2021-06-30 LAB — TSH: TSH: 0.99 u[IU]/mL (ref 0.35–5.50)

## 2021-06-30 LAB — CBC WITH DIFFERENTIAL/PLATELET
Basophils Absolute: 0 10*3/uL (ref 0.0–0.1)
Basophils Relative: 0.9 % (ref 0.0–3.0)
Eosinophils Absolute: 0.2 10*3/uL (ref 0.0–0.7)
Eosinophils Relative: 4.7 % (ref 0.0–5.0)
HCT: 39.7 % (ref 36.0–46.0)
Hemoglobin: 13.2 g/dL (ref 12.0–15.0)
Lymphocytes Relative: 34 % (ref 12.0–46.0)
Lymphs Abs: 1.3 10*3/uL (ref 0.7–4.0)
MCHC: 33.1 g/dL (ref 30.0–36.0)
MCV: 84.5 fl (ref 78.0–100.0)
Monocytes Absolute: 0.3 10*3/uL (ref 0.1–1.0)
Monocytes Relative: 8.8 % (ref 3.0–12.0)
Neutro Abs: 2 10*3/uL (ref 1.4–7.7)
Neutrophils Relative %: 51.6 % (ref 43.0–77.0)
Platelets: 195 10*3/uL (ref 150.0–400.0)
RBC: 4.7 Mil/uL (ref 3.87–5.11)
RDW: 13.4 % (ref 11.5–15.5)
WBC: 3.9 10*3/uL — ABNORMAL LOW (ref 4.0–10.5)

## 2021-06-30 LAB — URINALYSIS, MICROSCOPIC ONLY: RBC / HPF: NONE SEEN (ref 0–?)

## 2021-06-30 NOTE — Progress Notes (Signed)
CMP within normal limits.  TSH for thyroid within normal.  CBC ok white blood cell just scantly low.  Recheck CBC in one month to verify no change. Please add lab.  Urinalysis has some mucus and some calcium, report and back pain or signs of kidney stones.  Please order urinalysis with reflex to microscopic / urine culture and have patient go to lab.

## 2021-06-30 NOTE — Telephone Encounter (Signed)
Labs ordered for UA w/ reflex microscopic and urine culture future labs

## 2021-06-30 NOTE — Telephone Encounter (Signed)
Labs ordered for cbc for future labs

## 2021-07-04 ENCOUNTER — Telehealth: Payer: Self-pay

## 2021-07-04 NOTE — Telephone Encounter (Signed)
Placed call to pt to schedule for lab appt to leave urine sample. Pt states she will call back to either schedule or get the lab changed to be collected somewhere else.

## 2021-07-20 ENCOUNTER — Ambulatory Visit: Payer: BC Managed Care – PPO | Admitting: Adult Health

## 2021-07-20 ENCOUNTER — Other Ambulatory Visit: Payer: Self-pay

## 2021-07-20 VITALS — BP 110/90 | HR 74 | Temp 96.7°F | Ht 62.01 in | Wt 118.2 lb

## 2021-07-20 DIAGNOSIS — R82998 Other abnormal findings in urine: Secondary | ICD-10-CM

## 2021-07-20 DIAGNOSIS — I10 Essential (primary) hypertension: Secondary | ICD-10-CM | POA: Diagnosis not present

## 2021-07-20 MED ORDER — AMLODIPINE BESYLATE 5 MG PO TABS: 5.0000 mg | ORAL_TABLET | Freq: Every day | ORAL | 0 refills | Status: DC

## 2021-07-20 NOTE — Patient Instructions (Signed)

## 2021-07-20 NOTE — Progress Notes (Signed)
Acute Office Visit  Subjective:    Patient ID: Katherine Sharp, female    DOB: September 09, 1965, 55 y.o.   MRN: [id number]  Chief Complaint  Patient presents with   Follow-up    BP    Patient was seen 06/29/2021 for hypertension, she was having some headaches as well as some elevated blood pressure readings of 150s to 102.  She was started on Norvasc 2.5 mg at the last visit.  She was already taking lisinopril 10 mg and had a as needed for 5 mg lisinopril if blood pressure over 130/90. She reports her tendinitis and her headaches have resolved.  She denies any thunderclap or worst headache of life denies any numbness tingling speech changes or weakness.  Denies any edema. Blood pressure readings have improved systolically diastolic is still elevated. She is drinking 1 cup of caffeine per day. History of COVID 04/30/2020. She does continue Adderall who without is followed by Dr. Toy Care.  She is unable to take school without Adderall.  Patient is in today for started 2.5 mg Norvasc.  She is feeling much better.   Patient  denies any fever, body aches,chills, rash, chest pain, shortness of breath, nausea, vomiting, or diarrhea.  Denies dizziness, lightheadedness, pre syncopal or syncopal episodes.    Past Medical History:  Diagnosis Date   ADHD (attention deficit hyperactivity disorder)    Asthma    per pt mild , does not have inhaler   Depression    History of migraine    History of ovarian cyst    2001 s/p RSO for hemorrhagic cyst   Hypertension    Wears glasses     Past Surgical History:  Procedure Laterality Date   APPENDECTOMY  1984   BREAST ENHANCEMENT SURGERY Bilateral 1991   COLONOSCOPY WITH PROPOFOL N/A 10/02/2016   Procedure: COLONOSCOPY WITH PROPOFOL;  Surgeon: Manya Silvas, MD;  Location: [facility];  Service: Endoscopy;  Laterality: N/A;   DILATATION & CURETTAGE/HYSTEROSCOPY WITH MYOSURE N/A 07/01/2019   Procedure: DILATATION & CURETTAGE/HYSTEROSCOPY WITH MYOSURE;   Surgeon: Brien Few, MD;  Location: [facility];  Service: Gynecology;  Laterality: N/A;   LAPAROSCOPIC HYSTERECTOMY  08/05/2019   LAPAROSCOPIC SALPINGOOPHERECTOMY Right 2001   for hemorrhagic cyst   TONSILLECTOMY AND ADENOIDECTOMY  1970    Family History  Problem Relation Age of Onset   Early death Mother        Brain Tumor 49 yo   Cancer Maternal Grandmother        Uterus, Bladder, Lung and Colon   Uterine cancer Maternal Grandmother        lived until 24 years old   Colon cancer Maternal Grandmother    Bladder Cancer Maternal Grandmother    Breast cancer Maternal Grandmother     Social History   Socioeconomic History   Marital status: Married    Spouse name: Not on file   Number of children: Not on file   Years of education: Not on file   Highest education level: Not on file  Occupational History   Not on file  Tobacco Use   Smoking status: Never   Smokeless tobacco: Never  Vaping Use   Vaping Use: Never used  Substance and Sexual Activity   Alcohol use: Yes    Comment: rare   Drug use: Never   Sexual activity: Yes    Partners: Male    Birth control/protection: Post-menopausal, Other-see comments    Comment: Vasectomy  Other Topics Concern  Not on file  Social History Narrative   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 6.7 g 1   ALPRAZolam (XANAX) 0.25 MG tablet Take 0.25 mg by mouth 2 (two) times daily as needed.     amphetamine-dextroamphetamine (ADDERALL XR) 15 MG 24 hr capsule Take by mouth every morning.     ascorbic acid (VITAMIN C) 100 MG tablet Take 100 mg by mouth daily.     calcium carbonate (TUMS - DOSED IN MG ELEMENTAL CALCIUM) 500 MG chewable tablet Chew 1 tablet by mouth as needed for indigestion or heartburn.     diclofenac Sodium (VOLTAREN) 1 % GEL Apply 4 g topically 4 (four) times daily. 50 g 1   escitalopram (LEXAPRO) 20 MG tablet Take 1 tablet by mouth daily.     estradiol (ESTRACE) 0.1 MG/GM vaginal cream Place 1 g vaginally daily.      gabapentin (NEURONTIN) 100 MG capsule Take 2 capsules (200 mg total) by mouth at bedtime. 120 capsule 3   hydrocortisone 2.5 % lotion Apply topically 2 (two) times daily as needed.     lisinopril (ZESTRIL) 10 MG tablet TAKE 1 TABLET(10 MG) BY MOUTH DAILY 90 tablet 3   Multiple Vitamins-Minerals (MULTIVITAMIN ADULT PO) Take by mouth daily.     pantoprazole (PROTONIX) 40 MG tablet Take 40 mg by mouth daily.     tiZANidine (ZANAFLEX) 2 MG tablet TAKE 1 TO 2 TABLETS(2 TO 4 MG) BY MOUTH EVERY 6 HOURS AS NEEDED FOR MUSCLE SPASMS 60 tablet 1   zinc gluconate 50 MG tablet Take 50 mg by mouth daily.     amLODipine (NORVASC) 2.5 MG tablet Take 1 tablet (2.5 mg total) by mouth daily. 30 tablet 0   hydrochlorothiazide (MICROZIDE) 12.5 MG capsule TAKE 1 CAPSULE(12.5 MG) BY MOUTH DAILY 90 capsule 0   No facility-administered medications prior to visit.    Allergies  Allergen Reactions   Nsaids Nausea Only    Abdominal pains and indigestion    Review of Systems  Constitutional: Negative.   HENT: Negative.    Respiratory: Negative.    Cardiovascular: Negative.   Gastrointestinal: Negative.   Genitourinary: Negative.   Musculoskeletal: Negative.   Neurological: Negative.   Hematological: Negative.    Psychiatric/Behavioral: Negative.        Objective:    Physical Exam Vitals reviewed.  Constitutional:      General: She is not in acute distress.    Appearance: She is well-developed. She is not ill-appearing, toxic-appearing or diaphoretic.     Interventions: She is not intubated. HENT:     Head: Normocephalic and atraumatic.     Right Ear: External ear normal.     Left Ear: External ear normal.     Nose:  Nose normal.     Mouth/Throat:     Pharynx: No oropharyngeal exudate.  Eyes:     General: Lids are normal. No scleral icterus.       Right eye: No discharge.        Left eye: No discharge.     Conjunctiva/sclera: Conjunctivae normal.     Right eye: Right conjunctiva is not injected. No exudate or hemorrhage.    Left eye: Left conjunctiva is not injected. No exudate or hemorrhage.    Pupils: Pupils are equal, round, and reactive to light.  Neck:     Thyroid: No thyroid mass or thyromegaly.     Vascular: Normal carotid pulses. No carotid bruit, hepatojugular reflux or JVD.     Trachea: Trachea and phonation normal. No tracheal tenderness or tracheal deviation.     Meningeal: Brudzinski's sign and Kernig's sign absent.  Cardiovascular:     Rate and Rhythm: Normal rate and regular rhythm.     Pulses: Normal pulses.          Radial pulses are 2+ on the right side and 2+ on the left side.       Dorsalis pedis pulses are 2+ on the right side and 2+ on the left side.       Posterior tibial pulses are 2+ on the right side and 2+ on the left side.     Heart sounds: Normal heart sounds, S1 normal and S2 normal. Heart sounds not distant. No murmur heard.   No friction rub. No gallop.  Pulmonary:     Effort: Pulmonary effort is normal. No tachypnea, bradypnea, accessory muscle usage or respiratory distress. She is not intubated.     Breath sounds: Normal breath sounds. No stridor. No wheezing or rales.  Chest:     Chest wall: No tenderness.  Abdominal:     General: Bowel sounds  are normal. There is no distension or abdominal bruit.     Palpations: Abdomen is soft. There is no shifting dullness, fluid wave, hepatomegaly, splenomegaly, mass or pulsatile mass.     Tenderness: There is no abdominal tenderness. There is no guarding or rebound.     Hernia: No hernia is present.  Musculoskeletal:        General: No tenderness or deformity. Normal range of motion.     Cervical back: Full passive range of motion without pain, normal range of motion and neck supple. No edema, erythema or rigidity. No spinous process tenderness or muscular tenderness. Normal range of motion.  Lymphadenopathy:     Head:     Right side of head: No submental, submandibular, tonsillar, preauricular, posterior auricular or occipital adenopathy.     Left side of head: No submental, submandibular, tonsillar, preauricular, posterior auricular or occipital adenopathy.     Cervical: No cervical adenopathy.     Right cervical: No superficial, deep or posterior cervical adenopathy.    Left cervical: No superficial, deep or posterior cervical adenopathy.     Upper Body:     Right upper body: No supraclavicular or pectoral adenopathy.     Left upper body: No supraclavicular or pectoral adenopathy.  Skin:    General: Skin is warm and dry.     Coloration: Skin is not pale.     Findings: No abrasion, bruising, burn, ecchymosis, erythema, lesion, petechiae or rash.     Nails: There is no clubbing.  Neurological:     Mental Status: She is alert and oriented to person, place, and time.  GCS: GCS eye subscore is 4. GCS verbal subscore is 5. GCS motor subscore is 6.     Cranial Nerves: No cranial nerve deficit.     Sensory: No sensory deficit.     Motor: No tremor, atrophy, abnormal muscle tone or seizure activity.     Coordination: Coordination normal.     Gait: Gait normal.     Deep Tendon Reflexes: Reflexes are normal and symmetric. Reflexes normal. Babinski sign absent on the right side. Babinski sign  absent on the left side.     Reflex Scores:      Tricep reflexes are 2+ on the right side and 2+ on the left side.      Bicep reflexes are 2+ on the right side and 2+ on the left side.      Brachioradialis reflexes are 2+ on the right side and 2+ on the left side.      Patellar reflexes are 2+ on the right side and 2+ on the left side.      Achilles reflexes are 2+ on the right side and 2+ on the left side. Psychiatric:        Speech: Speech normal.        Behavior: Behavior normal.        Thought Content: Thought content normal.        Judgment: Judgment normal.    BP 110/90   Pulse 74   Temp (!) 96.7 F (35.9 C) (Temporal)   Ht 5' 2.01" (1.575 m)   Wt 118 lb 4 oz (53.6 kg)   LMP 07/23/2016   SpO2 98%   BMI 21.62 kg/m  Wt Readings from Last 3 Encounters:  07/20/21 118 lb 4 oz (53.6 kg)  06/29/21 119 lb 9.6 oz (54.3 kg)  05/25/20 120 lb (54.4 kg)   Vitals with BMI 07/20/2021 06/29/2021 05/25/2020  Height 5' 2.01" 5' 2.008" _0   Weight 118 lbs 4 oz 119 lbs 10 oz 120 lbs  BMI 21.62 71.06 26.94  Systolic 854 627 035  Diastolic 90 88 82  Pulse 74 76 73     Health Maintenance Due  Topic Date Due   Pneumococcal Vaccine 70-6 Years old (1 - PCV) Never done   Hepatitis C Screening  Never done   TETANUS/TDAP  Never done   Zoster Vaccines- Shingrix (1 of 2) Never done   COLONOSCOPY (Pts 45-72yr Insurance coverage will need to be confirmed)  10/02/2017   MAMMOGRAM  02/12/2019   PAP SMEAR-Modifier  02/12/2020   COVID-19 Vaccine (4 - Booster for Moderna series) 01/06/2021    There are no preventive care reminders to display for this patient.   Lab Results  Component Value Date   TSH 0.99 06/29/2021   Lab Results  Component Value Date   WBC 3.9 (L) 06/29/2021   HGB 13.2 06/29/2021   HCT 39.7 06/29/2021   MCV 84.5 06/29/2021   PLT 195.0 06/29/2021   Lab Results  Component Value Date   NA 140 06/29/2021   K 3.9 06/29/2021   CO2 29 06/29/2021   GLUCOSE 86  06/29/2021   BUN 15 06/29/2021   CREATININE 0.74 06/29/2021   BILITOT 0.6 06/29/2021   ALKPHOS 50 06/29/2021   AST 29 06/29/2021   ALT 31 06/29/2021   PROT 7.3 06/29/2021   ALBUMIN 5.0 06/29/2021   CALCIUM 10.1 06/29/2021   ANIONGAP 9 06/27/2019   GFR 90.88 06/29/2021   Lab Results  Component Value Date   CHOL 191 01/28/2020  Lab Results  Component Value Date   HDL 79.80 01/28/2020   Lab Results  Component Value Date   LDLCALC 94 01/28/2020   Lab Results  Component Value Date   TRIG 85.0 01/28/2020   Lab Results  Component Value Date   CHOLHDL 2 01/28/2020   Lab Results  Component Value Date   HGBA1C 5.5 01/28/2020       Assessment & Plan:   Problem List Items Addressed This Visit       Cardiovascular and Mediastinum   Hypertension   Relevant Medications   amLODipine (NORVASC) 5 MG tablet   Other Visit Diagnoses     Essential hypertension    -  Primary   Relevant Medications   amLODipine (NORVASC) 5 MG tablet   Other Relevant Orders   US Renal Artery Stenosis   Comp Met (CMET)   Abnormal casts in urine       Relevant Orders   Urine Microscopic Only (Completed)   CULTURE, URINE COMPREHENSIVE      Will increase amlodipine to 5 mg, patient will continue lisinopril 10 mg she will keep a log of her blood pressure readings hopefully diastolic will start to come down.  We will check a renal ultrasound for renal artery stenosis given patient is on lisinopril and has had uncontrolled hypertension in the past. She feels well without any further headaches denies any dizziness lightheadedness or presyncopal or syncopal episodes.  Given strict parameters and when to call the office for blood pressure goal is 130/80 or less.  Signs of hypertension and hypotension were discussed with patient.  Meds ordered this encounter  Medications   amLODipine (NORVASC) 5 MG tablet    Sig: Take 1 tablet (5 mg total) by mouth daily.    Dispense:  90 tablet    Refill:  0    Discontinue Norvasc, and hydrochlorothiazide. Medications Discontinued During This Encounter  Medication Reason   amLODipine (NORVASC) 2.5 MG tablet    hydrochlorothiazide (MICROZIDE) 12.5 MG capsule Completed Course    Patient will be having breast implant removal in the near future and will need a presurgical clearance, she will schedule that with her PCP and bring forms for that.   Red Flags discussed. The patient was given clear instructions to go to ER or return to medical center if any red flags develop, symptoms do not improve, worsen or new problems develop. They verbalized understanding.  Return in about 1 week (around 07/27/2021), or if symptoms worsen or fail to improve, for at any time for any worsening symptoms, Go to Emergency room/ urgent care if worse.  Marcille Buffy, FNP

## 2021-07-21 ENCOUNTER — Encounter: Payer: Self-pay | Admitting: Adult Health

## 2021-07-21 LAB — URINALYSIS, MICROSCOPIC ONLY
RBC / HPF: NONE SEEN (ref 0–?)
WBC, UA: NONE SEEN (ref 0–?)

## 2021-07-21 NOTE — Progress Notes (Signed)
Urine within normal limits.

## 2021-07-22 ENCOUNTER — Telehealth: Payer: Self-pay

## 2021-07-22 LAB — CULTURE, URINE COMPREHENSIVE
MICRO NUMBER:: 12726334
RESULT:: NO GROWTH
SPECIMEN QUALITY:: ADEQUATE

## 2021-07-22 NOTE — Telephone Encounter (Signed)
LMTCB for urine results.

## 2021-07-22 NOTE — Progress Notes (Signed)
Urine culture pending in process.

## 2021-07-24 NOTE — Progress Notes (Signed)
No growth of bacteria in urine

## 2021-08-01 ENCOUNTER — Other Ambulatory Visit: Payer: Self-pay

## 2021-08-01 ENCOUNTER — Telehealth: Payer: Self-pay | Admitting: Family

## 2021-08-01 ENCOUNTER — Other Ambulatory Visit: Payer: Self-pay | Admitting: Family

## 2021-08-01 ENCOUNTER — Ambulatory Visit: Payer: BC Managed Care – PPO | Admitting: Family

## 2021-08-01 ENCOUNTER — Encounter: Payer: Self-pay | Admitting: Family

## 2021-08-01 VITALS — BP 124/90 | HR 79 | Temp 98.7°F | Ht 62.0 in | Wt 120.0 lb

## 2021-08-01 DIAGNOSIS — I10 Essential (primary) hypertension: Secondary | ICD-10-CM

## 2021-08-01 DIAGNOSIS — Z01818 Encounter for other preprocedural examination: Secondary | ICD-10-CM

## 2021-08-01 DIAGNOSIS — J101 Influenza due to other identified influenza virus with other respiratory manifestations: Secondary | ICD-10-CM

## 2021-08-01 DIAGNOSIS — R051 Acute cough: Secondary | ICD-10-CM

## 2021-08-01 DIAGNOSIS — R0789 Other chest pain: Secondary | ICD-10-CM | POA: Diagnosis not present

## 2021-08-01 LAB — POCT INFLUENZA A/B
Influenza A, POC: POSITIVE — AB
Influenza B, POC: NEGATIVE

## 2021-08-01 LAB — POC COVID19 BINAXNOW: SARS Coronavirus 2 Ag: NEGATIVE

## 2021-08-01 MED ORDER — OSELTAMIVIR PHOSPHATE 75 MG PO CAPS
75.0000 mg | ORAL_CAPSULE | Freq: Two times a day (BID) | ORAL | 0 refills | Status: DC
Start: 2021-08-01 — End: 2021-08-01

## 2021-08-01 MED ORDER — ALBUTEROL SULFATE HFA 108 (90 BASE) MCG/ACT IN AERS: 2.0000 | INHALATION_SPRAY | Freq: Four times a day (QID) | RESPIRATORY_TRACT | 1 refills | Status: DC | PRN

## 2021-08-01 MED ORDER — OSELTAMIVIR PHOSPHATE 75 MG PO CAPS: 75.0000 mg | ORAL_CAPSULE | Freq: Two times a day (BID) | ORAL | 0 refills | Status: DC

## 2021-08-01 MED ORDER — HYDROCOD POLST-CPM POLST ER 10-8 MG/5ML PO SUER: 5.0000 mL | Freq: Every evening | ORAL | 0 refills | Status: DC | PRN

## 2021-08-01 NOTE — Progress Notes (Signed)
Subjective:    Patient ID: Katherine Sharp, female    DOB: [birth year], 55 y.o.   MRN: [id number]  CC: Katherine Sharp is a 55 y.o. female who presents today for an acute visit and preoperative evaluation    HPI: She complains of cough, chills x one day Endorses mild HA, occasional wheeze No sinus pain, sore throat, fever, cp. She has used albuterol inhaler in the past. She would like cough medication to help her at night.  Cough is worse at night She has not taken anything over-the-counter for symptoms Surgery is planned 08/03/21 for breast implants removable and she is requesting EKG. She had labs prior and declines repeating labs.   No history of any formal cardiology testing.  She notes that she was hiking this past fall and developed [place] and she did feel short of breath while hiking.  Otherwise, she has no episodes of shortness of breath or recurrence thereofShe feels her 'stamina' has decreased.  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    No leg swelling, cp.   She is compliant with lisinopril 10mg , amlodipine 5mg     Flu vaccinated; COVID-vaccinated with booster  HISTORY:  Past Medical History:  Diagnosis Date   ADHD (attention deficit hyperactivity disorder)    Asthma    per pt mild , does not have inhaler   Depression    History of migraine    History of ovarian cyst    2001 s/p RSO for hemorrhagic cyst   Hypertension    Wears glasses    Past Surgical History:  Procedure Laterality Date   APPENDECTOMY  1984   BREAST ENHANCEMENT SURGERY Bilateral 1991   COLONOSCOPY WITH PROPOFOL N/A 10/02/2016   Procedure: COLONOSCOPY WITH PROPOFOL;  Surgeon: Manya Silvas, MD;  Location: [facility];  Service: Endoscopy;  Laterality: N/A;   DILATATION & CURETTAGE/HYSTEROSCOPY WITH MYOSURE N/A 07/01/2019   Procedure: DILATATION & CURETTAGE/HYSTEROSCOPY WITH MYOSURE;  Surgeon: Brien Few, MD;  Location: [facility];   Service: Gynecology;  Laterality: N/A;   LAPAROSCOPIC HYSTERECTOMY  08/05/2019   LAPAROSCOPIC SALPINGOOPHERECTOMY Right 2001   for hemorrhagic cyst   TONSILLECTOMY AND ADENOIDECTOMY  1970   Family History  Problem Relation Age of Onset   Early death Mother        Brain Tumor 30 yo   Cancer Maternal Grandmother        Uterus, Bladder, Lung and Colon   Uterine cancer Maternal Grandmother        lived until 43 years old   Colon cancer Maternal Grandmother    Bladder Cancer Maternal Grandmother    Breast cancer Maternal Grandmother     Allergies: Nsaids Current Outpatient Medications on File Prior to Visit  Medication Sig Dispense Refill   ALPRAZolam (XANAX) 0.25 MG tablet Take 0.25 mg by mouth 2 (two) times daily as needed.     amLODipine (NORVASC) 5 MG tablet Take 1 tablet (5 mg total) by mouth daily. 90 tablet 0   amphetamine-dextroamphetamine (ADDERALL XR) 15 MG 24 hr capsule Take by mouth every morning.     ascorbic acid (VITAMIN C) 100 MG tablet Take 100 mg by mouth daily.     calcium carbonate (TUMS - DOSED IN MG ELEMENTAL CALCIUM) 500 MG chewable tablet Chew 1 tablet by mouth as needed for indigestion or heartburn.     diclofenac Sodium (VOLTAREN) 1 % GEL Apply 4 g topically 4 (four) times daily. 50 g 1  escitalopram (LEXAPRO) 20 MG tablet Take 1 tablet by mouth daily.     estradiol (ESTRACE) 0.1 MG/GM vaginal cream Place 1 g vaginally daily.      gabapentin (NEURONTIN) 100 MG capsule Take 2 capsules (200 mg total) by mouth at bedtime. 120 capsule 3   hydrocortisone 2.5 % lotion Apply topically 2 (two) times daily as needed.     lisinopril (ZESTRIL) 10 MG tablet TAKE 1 TABLET(10 MG) BY MOUTH DAILY 90 tablet 3   Multiple Vitamins-Minerals (MULTIVITAMIN ADULT PO) Take by mouth daily.     pantoprazole (PROTONIX) 40 MG tablet Take 40 mg by mouth daily.     tiZANidine (ZANAFLEX) 2 MG tablet TAKE 1 TO 2 TABLETS(2 TO 4 MG) BY MOUTH EVERY 6 HOURS AS NEEDED FOR MUSCLE SPASMS 60 tablet  1   zinc gluconate 50 MG tablet Take 50 mg by mouth daily.     No current facility-administered medications on file prior to visit.    Social History   Tobacco Use   Smoking status: Never   Smokeless tobacco: Never  Vaping Use   Vaping Use: Never used  Substance Use Topics   Alcohol use: Yes    Comment: rare   Drug use: Never    Review of Systems  Constitutional:  Positive for chills. Negative for fever.  HENT:  Positive for congestion.   Respiratory:  Positive for cough and wheezing. Negative for shortness of breath (resolved).   Cardiovascular:  Negative for chest pain and palpitations.  Gastrointestinal:  Negative for nausea and vomiting.  Neurological:  Positive for headaches ('mild').     Objective:    BP 124/90    Pulse 79    Temp 98.7 F (37.1 C) (Oral)    Ht 5\' 2"  (1.575 m)    Wt 120 lb (54.4 kg)    LMP 07/23/2016    SpO2 99%    BMI 21.95 kg/m    Physical Exam Vitals reviewed.  Constitutional:      Appearance: She is well-developed.  HENT:     Head: Normocephalic and atraumatic.     Right Ear: Hearing, tympanic membrane, ear canal and external ear normal. No decreased hearing noted. No drainage, swelling or tenderness. No middle ear effusion. No foreign body. Tympanic membrane is not erythematous or bulging.     Left Ear: Hearing, tympanic membrane, ear canal and external ear normal. No decreased hearing noted. No drainage, swelling or tenderness.  No middle ear effusion. No foreign body. Tympanic membrane is not erythematous or bulging.     Nose: Nose normal. No rhinorrhea.     Right Sinus: No maxillary sinus tenderness or frontal sinus tenderness.     Left Sinus: No maxillary sinus tenderness or frontal sinus tenderness.     Mouth/Throat:     Pharynx: Uvula midline. No oropharyngeal exudate or posterior oropharyngeal erythema.     Tonsils: No tonsillar abscesses.  Eyes:     Conjunctiva/sclera: Conjunctivae normal.  Cardiovascular:     Rate and Rhythm:  Regular rhythm.     Pulses: Normal pulses.     Heart sounds: Normal heart sounds.  Pulmonary:     Effort: Pulmonary effort is normal.     Breath sounds: Normal breath sounds. No wheezing, rhonchi or rales.  Lymphadenopathy:     Head:     Right side of head: No submental, submandibular, tonsillar, preauricular, posterior auricular or occipital adenopathy.     Left side of head: No submental, submandibular, tonsillar, preauricular, posterior auricular  or occipital adenopathy.     Cervical: No cervical adenopathy.  Skin:    General: Skin is warm and dry.  Neurological:     Mental Status: She is alert.  Psychiatric:        Speech: Speech normal.        Behavior: Behavior normal.        Thought Content: Thought content normal.       Assessment & Plan:   Problem List Items Addressed This Visit       Cardiovascular and Mediastinum   Hypertension    Chronic, stable. Continue lisinopril 10mg , amlodipine 5mg .         Respiratory   Influenza A - Primary    No acute respiratory distress.  Influenza A positive.  We agreed to start Tessalon and provided her with Tussionex to use at bedtime.  No wheezing on exam however she reports occasional wheeze and also provided her with albuterol inhaler.  She will let me know how she is doing.      Relevant Medications   oseltamivir (TAMIFLU) 75 MG capsule   albuterol (VENTOLIN HFA) 108 (90 Base) MCG/ACT inhaler   chlorpheniramine-HYDROcodone (TUSSIONEX PENNKINETIC ER) 10-8 MG/5ML SUER     Other   Chest tightness   Preoperative clearance    EKG shows SR with nonspecific t wave inversion V2, III. When compared to prior 06/26/2021 and 2/10/20216, lead v2 inversion is new. Fortunately she is asymptomatic however we agreed cardiac consult to further evaluate her as well as to provide risk stratification. Further more in setting of influenza, patient and I had jointly agreed that she would postpone surgery at this time.  She will reschedule after  cardiology referral.       Relevant Orders   EKG 12-Lead (Completed)   Ambulatory referral to Cardiology   Other Visit Diagnoses     Acute cough       Relevant Orders   POCT Influenza A/B (Completed)   POC COVID-19 (Completed)         I am having Katherine Sharp. Callaham start on oseltamivir and chlorpheniramine-HYDROcodone. I am also having her maintain her escitalopram, Multiple Vitamins-Minerals (MULTIVITAMIN ADULT PO), calcium carbonate, pantoprazole, estradiol, diclofenac Sodium, gabapentin, zinc gluconate, ascorbic acid, tiZANidine, lisinopril, amphetamine-dextroamphetamine, ALPRAZolam, hydrocortisone, amLODipine, and albuterol.   Meds ordered this encounter  Medications   oseltamivir (TAMIFLU) 75 MG capsule    Sig: Take 1 capsule (75 mg total) by mouth 2 (two) times daily.    Dispense:  10 capsule    Refill:  0    Order Specific Question:   Supervising Provider    Answer:   Deborra Medina L [[id number]]   albuterol (VENTOLIN HFA) 108 (90 Base) MCG/ACT inhaler    Sig: Inhale 2 puffs into the lungs every 6 (six) hours as needed for wheezing or shortness of breath.    Dispense:  6.7 g    Refill:  1    Order Specific Question:   Supervising Provider    Answer:   Deborra Medina L [[id number]]   chlorpheniramine-HYDROcodone (TUSSIONEX PENNKINETIC ER) 10-8 MG/5ML SUER    Sig: Take 5 mLs by mouth at bedtime as needed for cough.    Dispense:  70 mL    Refill:  0    Order Specific Question:   Supervising Provider    Answer:   Crecencio Mc [[id number]]    Return precautions given.   Risks, benefits, and alternatives of the medications and treatment plan prescribed today were  discussed, and patient expressed understanding.   Education regarding symptom management and diagnosis given to patient on AVS.  Continue to follow with Burnard Hawthorne, FNP for routine health maintenance.   Katherine Sharp and I agreed with plan.   Mable Paris, FNP

## 2021-08-01 NOTE — Assessment & Plan Note (Signed)
EKG shows SR with nonspecific t wave inversion V2, III. When compared to prior 06/26/2021 and 2/10/20216, lead v2 inversion is new. Fortunately she is asymptomatic however we agreed cardiac consult to further evaluate her as well as to provide risk stratification. Further more in setting of influenza, patient and I had jointly agreed that she would postpone surgery at this time.  She will reschedule after cardiology referral.

## 2021-08-01 NOTE — Assessment & Plan Note (Signed)
Chronic, stable. Continue lisinopril 10mg , amlodipine 5mg .

## 2021-08-01 NOTE — Assessment & Plan Note (Signed)
No acute respiratory distress.  Influenza A positive.  We agreed to start Tessalon and provided her with Tussionex to use at bedtime.  No wheezing on exam however she reports occasional wheeze and also provided her with albuterol inhaler.  She will let me know how she is doing.

## 2021-08-01 NOTE — Telephone Encounter (Signed)
Patient's daughter called, mothers phone is not working. Patient' s [facility] is out of Tamiflu. Patient's daughter called [facility], [place], they do have it. Please send there. Daughter's name is Benjamine Mola, her phone number is [phone]

## 2021-08-01 NOTE — Patient Instructions (Addendum)
Please postpone surgery until AFTER cardiology appointment. I have placed referral to cardiology due to nonspecific changes on your EKG. Let us know if you dont hear back within a week in regards to an appointment being scheduled.    Start tussionex cough syrup at bedtime. Please take cough medication at night only as needed. As we discussed, I do not recommend dosing throughout the day as coughing is a protective mechanism . It also helps to break up thick mucous.  Do not take cough suppressants with alcohol as can lead to trouble breathing. Advise caution if taking cough suppressant and operating machinery ( i.e driving a car) as you may feel very tired.   I have also refilled your inhaler.   Start tamiflu  Most common side effects are nausea, vomiting, diarrhea.  Flu may be able to be spread to others 1 day prior to symptom onset and up to 5 to 7 days.  Please remain in quarantine until you are 24 hours without fever and WITHOUT medication such as Tylenol or ibuprofen.  You may return to work/school once you are fever free per above for 24 hours and all symptoms nearly resolved.  It is reasonable to take further precautions especially if you are around young children, or anyone that is immunocompromised, including wearing a mask, informing  those around you, and extending quarantine.   If you develop shortness of breath, chest pain, palpitations, worsening fever, or inability to stay hydrated, please call 911 or report to the nearest emergency room.  Influenza, Adult Influenza is also called "the flu." It is an infection in the lungs, nose, and throat (respiratory tract). It spreads easily from person to person (is contagious). The flu causes symptoms that are like a cold, along with high fever and body aches. What are the causes? This condition is caused by the influenza virus. You can get the virus by: Breathing in droplets that are in the air after a person infected with the flu coughed  or sneezed. Touching something that has the virus on it and then touching your mouth, nose, or eyes. What increases the risk? Certain things may make you more likely to get the flu. These include: Not washing your hands often. Having close contact with many people during cold and flu season. Touching your mouth, eyes, or nose without first washing your hands. Not getting a flu shot every year. You may have a higher risk for the flu, and serious problems, such as a lung infection (pneumonia), if you: Are older than 65. Are pregnant. Have a weakened disease-fighting system (immune system) because of a disease or because you are taking certain medicines. Have a long-term (chronic) condition, such as: Heart, kidney, or lung disease. Diabetes. Asthma. Have a liver disorder. Are very overweight (morbidly obese). Have anemia. What are the signs or symptoms? Symptoms usually begin suddenly and last 4-14 days. They may include: Fever and chills. Headaches, body aches, or muscle aches. Sore throat. Cough. Runny or stuffy (congested) nose. Feeling discomfort in your chest. Not wanting to eat as much as normal. Feeling weak or tired. Feeling dizzy. Feeling sick to your stomach or throwing up. How is this treated? If the flu is found early, you can be treated with antiviral medicine. This can help to reduce how bad the illness is and how long it lasts. This may be given by mouth or through an IV tube. Taking care of yourself at home can help your symptoms get better. Your doctor may want  you to: Take over-the-counter medicines. Drink plenty of fluids. The flu often goes away on its own. If you have very bad symptoms or other problems, you may be treated in a hospital. Follow these instructions at home:   Activity Rest as needed. Get plenty of sleep. Stay home from work or school as told by your doctor. Do not leave home until you do not have a fever for 24 hours without taking  medicine. Leave home only to go to your doctor. Eating and drinking Take an ORS (oral rehydration solution). This is a drink that is sold at pharmacies and stores. Drink enough fluid to keep your pee pale yellow. Drink clear fluids in small amounts as you are able. Clear fluids include: Water. Ice chips. Fruit juice mixed with water. Low-calorie sports drinks. Eat bland foods that are easy to digest. Eat small amounts as you are able. These foods include: Bananas. Applesauce. Rice. Lean meats. Toast. Crackers. Do not eat or drink: Fluids that have a lot of sugar or caffeine. Alcohol. Spicy or fatty foods. General instructions Take over-the-counter and prescription medicines only as told by your doctor. Use a cool mist humidifier to add moisture to the air in your home. This can make it easier for you to breathe. When using a cool mist humidifier, clean it daily. Empty water and replace with clean water. Cover your mouth and nose when you cough or sneeze. Wash your hands with soap and water often and for at least 20 seconds. This is also important after you cough or sneeze. If you cannot use soap and water, use alcohol-based hand sanitizer. Keep all follow-up visits. How is this prevented?  Get a flu shot every year. You may get the flu shot in late summer, fall, or winter. Ask your doctor when you should get your flu shot. Avoid contact with people who are sick during fall and winter. This is cold and flu season. Contact a doctor if: You get new symptoms. You have: Chest pain. Watery poop (diarrhea). A fever. Your cough gets worse. You start to have more mucus. You feel sick to your stomach. You throw up. Get help right away if you: Have shortness of breath. Have trouble breathing. Have skin or nails that turn a bluish color. Have very bad pain or stiffness in your neck. Get a sudden headache. Get sudden pain in your face or ear. Cannot eat or drink without throwing  up. These symptoms may represent a serious problem that is an emergency. Get medical help right away. Call your local emergency services (911 in the [place] Do not wait to see if the symptoms will go away. Do not drive yourself to the hospital. Summary Influenza is also called "the flu." It is an infection in the lungs, nose, and throat. It spreads easily from person to person. Take over-the-counter and prescription medicines only as told by your doctor. Getting a flu shot every year is the best way to not get the flu. This information is not intended to replace advice given to you by your health care provider. Make sure you discuss any questions you have with your health care provider. Document Revised: 03/19/2020 Document Reviewed: 03/19/2020 Elsevier Patient Education  [place]. Oseltamivir Capsules What is this medication? OSELTAMIVIR (os el TAM i vir) prevents and treats infections caused by the flu virus (influenza). It works by slowing the spread of the flu virus in your body and reducing how long your symptoms last. It will not treat  colds or infections caused by bacteria or other viruses. It will not replace the annual flu vaccine. This medicine may be used for other purposes; ask your health care provider or pharmacist if you have questions. COMMON BRAND NAME(S): Tamiflu What should I tell my care team before I take this medication? They need to know if you have any of the following conditions: Difficulty swallowing Kidney disease An unusual or allergic reaction to oseltamivir, other medications, foods, dyes, or preservatives Pregnant or trying to get pregnant Breast-feeding How should I use this medication? Take this medication by mouth with water. Take it as directed on the prescription label at the same time every day. You can take it with or without food. If it upsets your stomach, take it with food. Take all of this medication unless your care team tells you to stop it  early. Keep taking it even if you think you are better. When taking whole capsules: Do not cut, crush or chew this medication. Swallow the capsules whole. When mixing capsule contents with liquid: Open the capsule and mix the contents in a small bowl with a small amount of a sweetened liquid such as chocolate syrup, corn syrup, caramel topping, or light brown sugar (dissolved in water). Stir the mixture and take the dose right away after mixing. Talk to your care team about the use of this medication in children. While it may be prescribed for selected conditions, precautions do apply. Overdosage: If you think you have taken too much of this medicine contact a poison control center or emergency room at once. NOTE: This medicine is only for you. Do not share this medicine with others. What if I miss a dose? If you miss a dose, take it as soon as you remember. If it is almost time for your next dose (within 2 hours), take only that dose. Do not take double or extra doses. What may interact with this medication? Intranasal influenza vaccine This list may not describe all possible interactions. Give your health care provider a list of all the medicines, herbs, non-prescription drugs, or dietary supplements you use. Also tell them if you smoke, drink alcohol, or use illegal drugs. Some items may interact with your medicine. What should I watch for while using this medication? Visit your care team for regular checks on your progress. Tell your care team if your symptoms do not start to get better or if they get worse. If you have the flu, you may be at an increased risk of developing seizures, confusion, or abnormal behavior. This occurs early in the illness, and more frequently in children and teens. These events are not common, but may result in accidental injury to the patient. Families and caregivers of patients should watch for signs of unusual behavior and contact a care team right away if the patient  shows signs of unusual behavior. To treat the flu, start taking this medication within 2 days of getting flu symptoms. This medication is not a substitute for the flu shot. Talk to your care team each year about an annual flu shot. What side effects may I notice from receiving this medication? Side effects that you should report to your care team as soon as possible: Allergic reactions--skin rash, itching, hives, swelling of the face, lips, tongue, or throat Confusion Hallucinations Redness, blistering, peeling, or loosening of the skin, including inside the mouth Seizures Tremors or shaking Trouble speaking Unusual changes in behavior Side effects that usually do not require medical attention (report  to your care team if they continue or are bothersome): Headache Nausea Vomiting This list may not describe all possible side effects. Call your doctor for medical advice about side effects. You may report side effects to FDA at 1-800-FDA-1088. Where should I keep my medication? Keep out of the reach of children and pets. Store at room temperature between 15 and 30 degrees C (59 and 86 degrees F). Throw away any unused medication after the expiration date. NOTE: This sheet is a summary. It may not cover all possible information. If you have questions about this medicine, talk to your doctor, pharmacist, or health care provider.  2022 Elsevier/Gold Standard (2021-04-19 00:00:00)

## 2021-08-01 NOTE — Telephone Encounter (Signed)
I called and let patient's daughter know that script was sent.

## 2021-09-19 ENCOUNTER — Encounter: Payer: Self-pay | Admitting: Cardiology

## 2021-09-19 ENCOUNTER — Other Ambulatory Visit: Payer: Self-pay

## 2021-09-19 ENCOUNTER — Ambulatory Visit: Payer: BC Managed Care – PPO | Admitting: Cardiology

## 2021-09-19 VITALS — BP 130/90 | HR 79 | Ht 62.0 in | Wt 119.0 lb

## 2021-09-19 DIAGNOSIS — Z01810 Encounter for preprocedural cardiovascular examination: Secondary | ICD-10-CM

## 2021-09-19 DIAGNOSIS — I10 Essential (primary) hypertension: Secondary | ICD-10-CM

## 2021-09-19 DIAGNOSIS — R0602 Shortness of breath: Secondary | ICD-10-CM

## 2021-09-19 MED ORDER — LISINOPRIL 20 MG PO TABS: 20.0000 mg | ORAL_TABLET | Freq: Every day | ORAL | 5 refills | Status: DC

## 2021-09-19 NOTE — Patient Instructions (Signed)
Medication Instructions:   Your physician has recommended you make the following change in your medication:    INCREASE your Lisinopril to 20 MG once a day.  *If you need a refill on your cardiac medications before your next appointment, please call your pharmacy*   Lab Work: None ordered If you have labs (blood work) drawn today and your tests are completely normal, you will receive your results only by: [facility] (if you have MyChart) OR A paper copy in the mail If you have any lab test that is abnormal or we need to change your treatment, we will call you to review the results.   Testing/Procedures:  Your physician has requested that you have an echocardiogram. Echocardiography is a painless test that uses sound waves to create images of your heart. It provides your doctor with information about the size and shape of your heart and how well your hearts chambers and valves are working. This procedure takes approximately one hour. There are no restrictions for this procedure.    Follow-Up: At [facility], you and your health needs are our priority.  As part of our continuing mission to provide you with exceptional heart care, we have created designated Provider Care Teams.  These Care Teams include your primary Cardiologist (physician) and Advanced Practice Providers (APPs -  Physician Assistants and Nurse Practitioners) who all work together to provide you with the care you need, when you need it.  We recommend signing up for the patient portal called "MyChart".  Sign up information is provided on this After Visit Summary.  MyChart is used to connect with patients for Virtual Visits (Telemedicine).  Patients are able to view lab/test results, encounter notes, upcoming appointments, etc.  Non-urgent messages can be sent to your provider as well.   To learn more about what you can do with MyChart, go to [email or web address].    Your next appointment:   Follow up after Echo    The format for your next appointment:   In Person  Provider:   You may see Kate Sable, MD or one of the following Advanced Practice Providers on your designated Care Team:   Murray Hodgkins, NP Christell Faith, PA-C Cadence Kathlen Mody, [place]    Other Instructions

## 2021-09-19 NOTE — Progress Notes (Signed)
Cardiology Office Note:    Date:  09/19/2021   ID:  Katherine Sharp, DOB 1966/06/08, MRN [id number]  PCP:  Burnard Hawthorne, FNP   [facility] Providers Cardiologist:  Kate Sable, MD     Referring MD: Burnard Hawthorne, FNP   Chief Complaint  Patient presents with   New Patient (Initial Visit)    Needs pre op clearance -- Procedure 03/29/202. Meds reviewed verbally with patient.    Katherine Sharp is a 56 y.o. female who is being seen today for the evaluation of preop cardiovascular exam at the request of Vidal Schwalbe Yvetta Coder, FNP.   History of Present Illness:    Katherine Sharp is a 56 y.o. female with a hx of hypertension, Lynch syndrome, who presents for preop evaluation and shortness of breath.  Patient has breast implants, is planning on taking out in about 2 months.  She denies chest pain or any history of heart disease.  States getting shortness of breath when she overexerts herself.  Had a murmur in the past, but seem to have gone away.  Takes amlodipine 5 mg, lisinopril 10 mg for BP control.  States her diastolic is usually elevated over 90.  Patient had a recent EKG at PCPs office showing nonspecific T wave changes.  Diagnosed with Lynch syndrome, follows up with GI, has frequent colonoscopy due to risk of colon cancer.  She is a Radio producer, due to the pandemic and recent abdominal surgery including hysterectomy, she has not been as physically active as before.   Past Medical History:  Diagnosis Date   ADHD (attention deficit hyperactivity disorder)    Asthma    per pt mild , does not have inhaler   Depression    History of migraine    History of ovarian cyst    2001 s/p RSO for hemorrhagic cyst   Hypertension    Wears glasses     Past Surgical History:  Procedure Laterality Date   APPENDECTOMY  1984   BREAST ENHANCEMENT SURGERY Bilateral 1991   COLONOSCOPY WITH PROPOFOL N/A 10/02/2016   Procedure: COLONOSCOPY WITH PROPOFOL;  Surgeon: Manya Silvas,  MD;  Location: [place];  Service: Endoscopy;  Laterality: N/A;   DILATATION & CURETTAGE/HYSTEROSCOPY WITH MYOSURE N/A 07/01/2019   Procedure: DILATATION & CURETTAGE/HYSTEROSCOPY WITH MYOSURE;  Surgeon: Brien Few, MD;  Location: [place];  Service: Gynecology;  Laterality: N/A;   LAPAROSCOPIC HYSTERECTOMY  08/05/2019   LAPAROSCOPIC SALPINGOOPHERECTOMY Right 2001   for hemorrhagic cyst   TONSILLECTOMY AND ADENOIDECTOMY  1970    Current Medications: Current Meds  Medication Sig   albuterol (VENTOLIN HFA) 108 (90 Base) MCG/ACT inhaler INHALE 2 PUFFS INTO THE LUNGS EVERY 6 HOURS AS NEEDED FOR WHEEZING OR SHORTNESS OF BREATH   ALPRAZolam (XANAX) 0.25 MG tablet Take 0.25 mg by mouth 2 (two) times daily as needed.   amLODipine (NORVASC) 5 MG tablet Take 1 tablet (5 mg total) by mouth daily.   amphetamine-dextroamphetamine (ADDERALL XR) 15 MG 24 hr capsule Take by mouth every morning.   ascorbic acid (VITAMIN C) 100 MG tablet Take 100 mg by mouth daily.   calcium carbonate (TUMS - DOSED IN MG ELEMENTAL CALCIUM) 500 MG chewable tablet Chew 1 tablet by mouth as needed for indigestion or heartburn.   escitalopram (LEXAPRO) 20 MG tablet Take 1 tablet by mouth daily.   estradiol (ESTRACE) 0.1 MG/GM vaginal cream Place 1 g vaginally daily.    hydrocortisone 2.5 % lotion Apply topically  2 (two) times daily as needed.   Multiple Vitamins-Minerals (MULTIVITAMIN ADULT PO) Take by mouth daily.   pantoprazole (PROTONIX) 40 MG tablet Take 40 mg by mouth daily.   tiZANidine (ZANAFLEX) 2 MG tablet TAKE 1 TO 2 TABLETS(2 TO 4 MG) BY MOUTH EVERY 6 HOURS AS NEEDED FOR MUSCLE SPASMS   zinc gluconate 50 MG tablet Take 50 mg by mouth daily.   [DISCONTINUED] lisinopril (ZESTRIL) 10 MG tablet TAKE 1 TABLET(10 MG) BY MOUTH DAILY     Allergies:   Nsaids   Social History   Socioeconomic History   Marital status: Married    Spouse name: Not on file   Number of children: Not on file    Years of education: Not on file   Highest education level: Not on file  Occupational History   Not on file  Tobacco Use   Smoking status: Never   Smokeless tobacco: Never  Vaping Use   Vaping Use: Never used  Substance and Sexual Activity   Alcohol use: Yes    Comment: rare   Drug use: Never   Sexual activity: Yes    Partners: Male    Birth control/protection: Post-menopausal, Other-see comments    Comment: Vasectomy  Other Topics Concern   Not on file  Social History Narrative   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    Pets: 3 cats inside and 1 dog inside    Teacher [place]   Social Determinants of Health   Financial Resource Strain: Not on file  Food Insecurity: Not on file  Transportation Needs: Not on file  Physical Activity: Not on file  Stress: Not on file  Social Connections: Not on file     Family History: The patient's family history includes Bladder Cancer in her maternal grandmother; Breast cancer in her maternal grandmother; Cancer in her maternal grandmother; Colon cancer in her maternal grandmother; Early death in her mother; Uterine cancer in her maternal grandmother.  ROS:   Please see the history of present illness.     All other systems reviewed and are negative.  EKGs/Labs/Other Studies Reviewed:    The following studies were reviewed today:   EKG:  EKG is  ordered today.  The ekg ordered today demonstrates normal sinus rhythm, short PR, otherwise normal ECG.  Recent Labs: 06/29/2021: ALT 31; BUN 15; Creatinine, Ser 0.74; Hemoglobin 13.2; Platelets 195.0; Potassium 3.9; Sodium 140; TSH 0.99  Recent Lipid Panel    Component Value Date/Time   CHOL 191 01/28/2020 0830   TRIG 85.0 01/28/2020 0830   HDL 79.80 01/28/2020 0830   CHOLHDL 2 01/28/2020 0830   VLDL 17.0 01/28/2020 0830   LDLCALC 94 01/28/2020 0830     Risk  Assessment/Calculations:          Physical Exam:    VS:  BP 130/90 (BP Location: Left Arm, Patient Position: Sitting, Cuff Size: Normal)    Pulse 79    Ht 5\' 2"  (1.575 m)    Wt 119 lb (54 kg)    LMP 07/23/2016    SpO2 98%    BMI 21.77 kg/m     Wt Readings from Last 3 Encounters:  09/19/21 119 lb (54 kg)  08/01/21 120 lb (54.4 kg)  07/20/21 118 lb 4 oz (53.6 kg)     GEN:  Well nourished, well developed in no acute distress HEENT:  Normal NECK: No JVD; No carotid bruits LYMPHATICS: No lymphadenopathy CARDIAC: RRR, no murmurs, rubs, gallops RESPIRATORY:  Clear to auscultation without rales, wheezing or rhonchi  ABDOMEN: Soft, non-tender, non-distended MUSCULOSKELETAL:  No edema; No deformity  SKIN: Warm and dry NEUROLOGIC:  Alert and oriented x 3 PSYCHIATRIC:  Normal affect   ASSESSMENT:    1. Pre-operative cardiovascular examination   2. Primary hypertension   3. SOB (shortness of breath)    PLAN:    In order of problems listed above:  Preop evaluation prior to breast implant removal.  Denies chest pain, no history of heart disease.  EKG in the office today normal.  Okay to proceed from a cardiac perspective, symptoms of shortness of breath, likely deconditioning.  Will obtain echo, but this should not hold patient's procedure. Hypertension, diastolic elevated.  Increase lisinopril to 20 mg daily.  Continue amlodipine 5 mg daily.  Low-salt diet advised. Shortness of breath, obtain echo to evaluate any gross structural abnormalities.  Follow-up after echocardiogram.      Medication Adjustments/Labs and Tests Ordered: Current medicines are reviewed at length with the patient today.  Concerns regarding medicines are outlined above.  Orders Placed This Encounter  Procedures   EKG 12-Lead   ECHOCARDIOGRAM COMPLETE   Meds ordered this encounter  Medications   lisinopril (ZESTRIL) 20 MG tablet    Sig: Take 1 tablet (20 mg total) by mouth daily.    Dispense:  30 tablet     Refill:  5    Patient Instructions  Medication Instructions:   Your physician has recommended you make the following change in your medication:    INCREASE your Lisinopril to 20 MG once a day.  *If you need a refill on your cardiac medications before your next appointment, please call your pharmacy*   Lab Work: None ordered If you have labs (blood work) drawn today and your tests are completely normal, you will receive your results only by: [place] (if you have MyChart) OR A paper copy in the mail If you have any lab test that is abnormal or we need to change your treatment, we will call you to review the results.   Testing/Procedures:  Your physician has requested that you have an echocardiogram. Echocardiography is a painless test that uses sound waves to create images of your heart. It provides your doctor with information about the size and shape of your heart and how well your hearts chambers and valves are working. This procedure takes approximately one hour. There are no restrictions for this procedure.    Follow-Up: At [facility], you and your health needs are our priority.  As part of our continuing mission to provide you with exceptional heart care, we have created designated Provider Care Teams.  These Care Teams include your primary Cardiologist (physician) and Advanced Practice Providers (APPs -  Physician Assistants and Nurse Practitioners) who all work together to provide you with the care you need, when you need it.  We recommend signing up for the patient portal called "MyChart".  Sign up information is provided on this After Visit Summary.  MyChart is used to connect with patients for Virtual Visits (Telemedicine).  Patients are able to view lab/test results, encounter notes, upcoming appointments, etc.  Non-urgent messages can be sent to your provider as well.   To learn more about what you can do with MyChart, go to [email or web address].     Your next appointment:   Follow up after Echo   The  format for your next appointment:   In Person  Provider:   You may see Kate Sable, MD or one of the following Advanced Practice Providers on your designated Care Team:   Murray Hodgkins, NP Christell Faith, PA-C Cadence Kathlen Mody, [place]    Other Instructions     Signed, Kate Sable, MD  09/19/2021 11:53 AM    [place]

## 2021-09-20 ENCOUNTER — Encounter: Payer: Self-pay | Admitting: Internal Medicine

## 2021-09-20 ENCOUNTER — Other Ambulatory Visit: Payer: Self-pay

## 2021-09-20 ENCOUNTER — Telehealth (INDEPENDENT_AMBULATORY_CARE_PROVIDER_SITE_OTHER): Payer: BC Managed Care – PPO | Admitting: Internal Medicine

## 2021-09-20 VITALS — BP 117/85 | Ht 62.0 in | Wt 119.0 lb

## 2021-09-20 DIAGNOSIS — J0121 Acute recurrent ethmoidal sinusitis: Secondary | ICD-10-CM

## 2021-09-20 DIAGNOSIS — B3731 Acute candidiasis of vulva and vagina: Secondary | ICD-10-CM

## 2021-09-20 MED ORDER — AZITHROMYCIN 250 MG PO TABS: ORAL_TABLET | ORAL | 0 refills | Status: AC

## 2021-09-20 MED ORDER — CETIRIZINE HCL 10 MG PO TABS
10.0000 mg | ORAL_TABLET | Freq: Every day | ORAL | 0 refills | Status: DC | PRN
Start: 2021-09-20 — End: 2021-11-28

## 2021-09-20 MED ORDER — FLUCONAZOLE 150 MG PO TABS: 150.0000 mg | ORAL_TABLET | Freq: Once | ORAL | 0 refills | Status: AC

## 2021-09-20 MED ORDER — FLUTICASONE PROPIONATE 50 MCG/ACT NA SUSP: 2.0000 | Freq: Every day | NASAL | 6 refills | Status: DC | PRN

## 2021-09-20 NOTE — Progress Notes (Signed)
Virtual Visit via Video Note  I connected with Katherine Sharp  on 09/20/21 at  7:30 AM EST by a video enabled telemedicine application and verified that I am speaking with the correct person using two identifiers.  Location patient: [place] at work Location provider:work or home office Persons participating in the virtual visit: patient, provider  I discussed the limitations and requested verbal permission for telemedicine visit. The patient expressed understanding and agreed to proceed.   HPI:  Acute telemedicine visit for : -had flu in 07/2021 and h/o chronic sinusitis f/u Dr. 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  Past Medical History:  Diagnosis Date   ADHD (attention deficit hyperactivity disorder)    Asthma    per pt mild , does not have inhaler   Depression    History of migraine    History of ovarian cyst    2001 s/p RSO for hemorrhagic cyst   Hypertension    Wears glasses     Past Surgical History:  Procedure Laterality Date   APPENDECTOMY  1984   BREAST ENHANCEMENT SURGERY Bilateral 1991   COLONOSCOPY WITH PROPOFOL N/A 10/02/2016   Procedure: COLONOSCOPY WITH PROPOFOL;  Surgeon: Manya Silvas, MD;  Location: [place] [facility];  Service: Endoscopy;  Laterality: N/A;   DILATATION & CURETTAGE/HYSTEROSCOPY WITH  MYOSURE N/A 07/01/2019   Procedure: DILATATION & CURETTAGE/HYSTEROSCOPY WITH MYOSURE;  Surgeon: Brien Few, MD;  Location: [place];  Service: Gynecology;  Laterality: N/A;   LAPAROSCOPIC HYSTERECTOMY  08/05/2019   LAPAROSCOPIC SALPINGOOPHERECTOMY Right 2001   for hemorrhagic cyst   TONSILLECTOMY AND ADENOIDECTOMY  1970     Current Outpatient Medications:    amLODipine (NORVASC) 5 MG tablet, Take 1 tablet (5 mg total) by mouth daily., Disp: 90 tablet, Rfl: 0   amphetamine-dextroamphetamine (ADDERALL XR) 15 MG 24 hr capsule, Take by mouth every morning., Disp: , Rfl:    ascorbic acid (VITAMIN C) 100 MG tablet, Take 100 mg by mouth daily., Disp: , Rfl:    azithromycin (ZITHROMAX) 250 MG tablet, With food Take 2 tablets on day 1, then 1 tablet daily on days 2 through 5, Disp: 6 tablet, Rfl: 0   calcium carbonate (TUMS - DOSED IN MG ELEMENTAL CALCIUM) 500 MG chewable tablet, Chew 1 tablet by mouth as needed for indigestion or heartburn., Disp: , Rfl:    cetirizine (ZYRTEC) 10 MG tablet, Take 1 tablet (10 mg total) by mouth daily as needed for allergies., Disp: 90 tablet, Rfl: 0   escitalopram (LEXAPRO) 20 MG tablet, Take 1 tablet by mouth daily., Disp: , Rfl:    estradiol (ESTRACE) 0.1 MG/GM vaginal cream, Place 1 g vaginally daily. , Disp: , Rfl:    fluconazole (DIFLUCAN) 150 MG tablet, Take 1 tablet (150 mg total) by mouth once for 1 dose., Disp: 1 tablet, Rfl: 0   fluticasone (FLONASE) 50 MCG/ACT nasal spray, Place  2 sprays into both nostrils daily as needed for allergies or rhinitis., Disp: 16 g, Rfl: 6   hydrocortisone 2.5 % lotion, Apply topically 2 (two) times daily as needed., Disp: , Rfl:    lisinopril (ZESTRIL) 20 MG tablet, Take 1 tablet (20 mg total) by mouth daily., Disp: 30 tablet, Rfl: 5   Multiple Vitamins-Minerals (MULTIVITAMIN ADULT PO), Take by mouth daily., Disp: , Rfl:    pantoprazole (PROTONIX) 40 MG tablet, Take 40 mg by mouth daily., Disp: , Rfl:     zinc gluconate 50 MG tablet, Take 50 mg by mouth daily., Disp: , Rfl:    albuterol (VENTOLIN HFA) 108 (90 Base) MCG/ACT inhaler, INHALE 2 PUFFS INTO THE LUNGS EVERY 6 HOURS AS NEEDED FOR WHEEZING OR SHORTNESS OF BREATH (Patient not taking: Reported on 09/20/2021), Disp: 18 g, Rfl: 0   ALPRAZolam (XANAX) 0.25 MG tablet, Take 0.25 mg by mouth 2 (two) times daily as needed. (Patient not taking: Reported on 09/20/2021), Disp: , Rfl:    tiZANidine (ZANAFLEX) 2 MG tablet, TAKE 1 TO 2 TABLETS(2 TO 4 MG) BY MOUTH EVERY 6 HOURS AS NEEDED FOR MUSCLE SPASMS (Patient not taking: Reported on 09/20/2021), Disp: 60 tablet, Rfl: 1  EXAM:  VITALS per patient if applicable:  GENERAL: alert, oriented, appears well and in no acute distress  HEENT: atraumatic, conjunttiva clear, no obvious abnormalities on inspection of external nose and ears  NECK: normal movements of the head and neck  LUNGS: on inspection no signs of respiratory distress, breathing rate appears normal, no obvious gross SOB, gasping or wheezing  CV: no obvious cyanosis  MS: moves all visible extremities without noticeable abnormality  PSYCH/NEURO: pleasant and cooperative, no obvious depression or anxiety, speech and thought processing grossly intact  ASSESSMENT AND PLAN:  Discussed the following assessment and plan:  Acute recurrent ethmoidal sinusitis - Plan: azithromycin (ZITHROMAX) 250 MG tablet, cetirizine (ZYRTEC) 10 MG tablet, fluticasone (FLONASE) 50 MCG/ACT nasal spray NS F/u Dr. Tami Ribas if not better  Yeast vaginitis - Plan: fluconazole (DIFLUCAN) 150 MG tablet  -we discussed possible serious and likely etiologies, options for evaluation and workup, limitations of telemedicine visit vs in person visit, treatment, treatment risks and precautions. Pt is agreeable to treatment via telemedicine at this moment.   I discussed the assessment and treatment plan with the patient. The patient was provided an opportunity to ask questions  and all were answered. The patient agreed with the plan and demonstrated an understanding of the instructions.    Time spent 20 min Delorise Jackson, MD

## 2021-10-01 ENCOUNTER — Other Ambulatory Visit: Payer: Self-pay | Admitting: Family

## 2021-10-01 DIAGNOSIS — I10 Essential (primary) hypertension: Secondary | ICD-10-CM

## 2021-10-07 ENCOUNTER — Ambulatory Visit (INDEPENDENT_AMBULATORY_CARE_PROVIDER_SITE_OTHER): Payer: BC Managed Care – PPO

## 2021-10-07 ENCOUNTER — Other Ambulatory Visit: Payer: Self-pay

## 2021-10-07 DIAGNOSIS — R0602 Shortness of breath: Secondary | ICD-10-CM | POA: Diagnosis not present

## 2021-10-07 LAB — ECHOCARDIOGRAM COMPLETE
AR max vel: 2.29 cm2
AV Area VTI: 2.01 cm2
AV Area mean vel: 2.03 cm2
AV Mean grad: 4 mmHg
AV Peak grad: 8 mmHg
Ao pk vel: 1.41 m/s
Area-P 1/2: 3.21 cm2
Calc EF: 59.4 %
S' Lateral: 2.9 cm
Single Plane A2C EF: 63.1 %
Single Plane A4C EF: 57 %

## 2021-10-11 ENCOUNTER — Ambulatory Visit: Payer: BC Managed Care – PPO | Admitting: Cardiology

## 2021-10-31 ENCOUNTER — Encounter: Payer: Self-pay | Admitting: Family

## 2021-10-31 ENCOUNTER — Other Ambulatory Visit: Payer: Self-pay | Admitting: Adult Health

## 2021-10-31 DIAGNOSIS — I10 Essential (primary) hypertension: Secondary | ICD-10-CM

## 2021-10-31 NOTE — Telephone Encounter (Signed)
Pt called in requesting refill on medication (amLODipine (NORVASC) 5 MG tablet). Pt stated that the pharmacy sent over request to our office... Pt stated that she have sent mychart message and no one have responded... Below is the last mychart message...  ? ?"Refills have been requested for the following medications: ?  ?    amLODipine (NORVASC) 5 MG tablet [Michelle Smith Flinchum] ?  ?Preferred pharmacy: [id number] [address], [place] ?Delivery method: Pickup ?

## 2021-11-01 ENCOUNTER — Other Ambulatory Visit: Payer: Self-pay

## 2021-11-01 DIAGNOSIS — I10 Essential (primary) hypertension: Secondary | ICD-10-CM

## 2021-11-01 MED ORDER — AMLODIPINE BESYLATE 5 MG PO TABS: 5.0000 mg | ORAL_TABLET | Freq: Every day | ORAL | 0 refills | Status: DC

## 2021-11-28 ENCOUNTER — Encounter: Payer: Self-pay | Admitting: Cardiology

## 2021-11-28 ENCOUNTER — Ambulatory Visit: Payer: BC Managed Care – PPO | Admitting: Cardiology

## 2021-11-28 ENCOUNTER — Telehealth: Payer: Self-pay

## 2021-11-28 VITALS — BP 100/70 | HR 78 | Ht 62.0 in | Wt 119.0 lb

## 2021-11-28 DIAGNOSIS — R0602 Shortness of breath: Secondary | ICD-10-CM

## 2021-11-28 DIAGNOSIS — R0683 Snoring: Secondary | ICD-10-CM | POA: Diagnosis not present

## 2021-11-28 DIAGNOSIS — I10 Essential (primary) hypertension: Secondary | ICD-10-CM | POA: Diagnosis not present

## 2021-11-28 NOTE — Telephone Encounter (Signed)
LMTCB to office to check and see if patient picked up medication  Amlodipine was refilled on 11/01/2021 ?

## 2021-11-28 NOTE — Progress Notes (Signed)
?Cardiology Office Note:   ? ?Date:  11/28/2021  ? ?ID:  Katherine Sharp, DOB 1966-05-24, MRN [id number] ? ?PCP:  Burnard Hawthorne, FNP ?  ?[facility] Providers ?Cardiologist:  Kate Sable, MD    ? ?Referring MD: Burnard Hawthorne, FNP  ? ?Chief Complaint  ?Patient presents with  ? Other  ?  Follow up post ECHO -- Patient would like to discuss having a sleep study. Meds reviewed verbally with patient.   ? ? ? ?History of Present Illness:   ? ?Katherine Sharp is a 56 y.o. female with a hx of hypertension, Lynch syndrome, who presents for follow-up.  Previously seen for preop evaluation, hypertension and shortness of breath. ? ?Breast implant removal is being planned.  Procedure deemed low risk from a cardiac perspective, she underwent procedure successfully.  Blood pressures were elevated, lisinopril was increased to 20 mg daily, amlodipine was continued.  Echocardiogram was ordered to evaluate any structural abnormalities, shortness of breath edema likely from deconditioning.  Presents for echocardiogram results. ? ?Patient endorsed snoring, daytime fatigue.  Has been told by mother that she stopped breathing sometimes during the sleep. ? ?Prior notes/studies ?Echocardiogram 09/2021, EF 60 to 65%, impaired relaxation. ? ? ?Past Medical History:  ?Diagnosis Date  ? ADHD (attention deficit hyperactivity disorder)   ? Asthma   ? per pt mild , does not have inhaler  ? Depression   ? History of migraine   ? History of ovarian cyst   ? 2001 s/p RSO for hemorrhagic cyst  ? Hypertension   ? Wears glasses   ? ? ?Past Surgical History:  ?Procedure Laterality Date  ? APPENDECTOMY  1984  ? BREAST ENHANCEMENT SURGERY Bilateral 1991  ? COLONOSCOPY WITH PROPOFOL N/A 10/02/2016  ? Procedure: COLONOSCOPY WITH PROPOFOL;  Surgeon: Manya Silvas, MD;  Location[facility];  Service: Endoscopy;  Laterality: N/A;  ? DILATATION & CURETTAGE/HYSTEROSCOPY WITH MYOSURE N/A 07/01/2019  ? Procedure: [place];  Surgeon: Brien Few, MD;  Location: [facility];  Service: Gynecology;  Laterality: N/A;  ? LAPAROSCOPIC HYSTERECTOMY  08/05/2019  ? LAPAROSCOPIC SALPINGOOPHERECTOMY Right 2001  ? for hemorrhagic cyst  ? [place]  ? ? ?Current Medications: ?Current Meds  ?Medication Sig  ? ALPRAZolam (XANAX) 0.25 MG tablet Take 0.25 mg by mouth 2 (two) times daily as needed.  ? amLODipine (NORVASC) 5 MG tablet Take 1 tablet (5 mg total) by mouth daily.  ? amphetamine-dextroamphetamine (ADDERALL XR) 15 MG 24 hr capsule Take by mouth every morning.  ? ascorbic acid (VITAMIN C) 100 MG tablet Take 100 mg by mouth daily.  ? calcium carbonate (TUMS - DOSED IN MG ELEMENTAL CALCIUM) 500 MG chewable tablet Chew 1 tablet by mouth as needed for indigestion or heartburn.  ? escitalopram (LEXAPRO) 20 MG tablet Take 1 tablet by mouth daily.  ? estradiol (ESTRACE) 0.1 MG/GM vaginal cream Place 1 g vaginally daily.   ? fluticasone (FLONASE) 50 MCG/ACT nasal spray Place 2 sprays into both nostrils daily as needed for allergies or rhinitis.  ? hydrocortisone 2.5 % lotion Apply topically 2 (two) times daily as needed.  ? lisinopril (ZESTRIL) 20 MG tablet Take 1 tablet (20 mg total) by mouth daily.  ? Multiple Vitamins-Minerals (MULTIVITAMIN ADULT PO) Take by mouth daily.  ? pantoprazole (PROTONIX) 40 MG tablet Take 40 mg by mouth daily.  ? zinc gluconate 50 MG tablet Take 50 mg by mouth daily.  ?  ? ?  Allergies:   Nsaids  ? ?Social History  ? ?Socioeconomic History  ? Marital status: Married  ?  Spouse name: Not on file  ? Number of children: Not on file  ? Years of education: Not on file  ? Highest education level: Not on file  ?Occupational History  ? Not on file  ?Tobacco Use  ? Smoking status: Never  ? Smokeless tobacco: Never  ?Vaping Use  ? Vaping Use: Never used  ?Substance and Sexual Activity  ? Alcohol use: Yes  ?  Comment: rare  ? Drug use: Never  ? Sexual  activity: Yes  ?  Partners: Male  ?  Birth control/protection: Post-menopausal, Other-see comments  ?  Comment: Vasectomy  ?Other Topics Concern  ? Not on file  ?Social History Narrative  ? 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 Caffeine- 1 cup coffee in the morning (no soda/tea)   ? Lives with husband and 2 daughters.   ? Pets: 3 cats inside and 1 dog inside   ? Teacher [employer]  ? ?Social Determinants of Health  ? ?Financial Resource Strain: Not on file  ?Food Insecurity: Not on file  ?Transportation Needs: Not on file  ?Physical Activity: Not on file  ?Stress: Not on file  ?Social Connections: Not on file  ?  ? ?Family History: ?The patient's family history includes Bladder Cancer in her maternal grandmother; Breast cancer in her maternal grandmother; Cancer in her maternal grandmother; Colon cancer in her maternal grandmother; Early death in her mother; Uterine cancer in her maternal grandmother. ? ?ROS:   ?Please see the history of present illness.    ? All other systems reviewed and are negative. ? ?EKGs/Labs/Other Studies Reviewed:   ? ?The following studies were reviewed today: ? ? ?EKG:  EKG is  ordered today.  The ekg ordered today demonstrates normal sinus rhythm, short PR, otherwise normal ECG. ? ?Recent Labs: ?06/29/2021: ALT 31; BUN 15; Creatinine, Ser 0.74; Hemoglobin 13.2; Platelets 195.0; Potassium 3.9; Sodium 140; TSH 0.99  ?Recent Lipid Panel ?   ?Component Value Date/Time  ? CHOL 191 01/28/2020 0830  ? TRIG 85.0 01/28/2020 0830  ? HDL 79.80 01/28/2020 0830  ? CHOLHDL 2 01/28/2020 0830  ? VLDL 17.0 01/28/2020 0830  ? [place] 94 01/28/2020 0830  ? ? ? ?Risk Assessment/Calculations:   ? ? ?    ? ?Physical Exam:   ? ?VS:  BP 100/70 (BP Location: Left Arm, Patient Position: Sitting, Cuff Size: Normal)   Pulse 78   Ht '5\' 2"'$  (1.575 m)   Wt 119 lb (54 kg)   LMP 07/23/2016   SpO2 97%   BMI 21.77 kg/m?    ? ?Wt Readings from Last 3 Encounters:  ?11/28/21 119 lb (54 kg)  ?09/20/21  119 lb (54 kg)  ?09/19/21 119 lb (54 kg)  ?  ? ?GEN:  Well nourished, well developed in no acute distress ?HEENT: Normal ?NECK: No JVD; No carotid bruits ?CARDIAC: RRR, no murmurs, rubs, gallops ?RESPIRATORY:  Clear to auscultation without rales, wheezing or rhonchi  ?ABDOMEN: Soft, non-tender, non-distended ?MUSCULOSKELETAL:  No edema; No deformity  ?SKIN: Warm and dry ?NEUROLOGIC:  Alert and oriented x 3 ?PSYCHIATRIC:  Normal affect  ? ?ASSESSMENT:   ? ?1. Primary hypertension   ?2. SOB (shortness of breath)   ?3. Snoring   ? ?PLAN:   ? ?In order of problems listed above: ? ?Hypertension, BP controlled.  Continue lisinopril 20 mg daily, amlodipine 5  mg daily.  Low-salt diet advised. ?Shortness of breath, echo 09/2021 EF 60 to 65%, impaired relaxation.  No findings to suggest etiology of shortness of breath.  Shortness of breath likely from deconditioning. ?Snoring, refer to sleep specialist for osa eval and treatment. ? ?Follow-up as needed ? ?   ? ?Medication Adjustments/Labs and Tests Ordered: ?Current medicines are reviewed at length with the patient today.  Concerns regarding medicines are outlined above.  ?Orders Placed This Encounter  ?Procedures  ? Ambulatory referral to Pulmonology  ? ?No orders of the defined types were placed in this encounter. ? ? ?Patient Instructions  ?Medication Instructions:  ? ?Your physician recommends that you continue on your current medications as directed. Please refer to the Current Medication list given to you today. ? ?*If you need a refill on your cardiac medications before your next appointment, please call your pharmacy* ? ? ?Lab Work: ? ?None Ordered ? ?If you have labs (blood work) drawn today and your tests are completely normal, you will receive your results only by: ?MyChart Message (if you have MyChart) OR ?A paper copy in the mail ?If you have any lab test that is abnormal or we need to change your treatment, we will call you to review the  results. ? ? ?Testing/Procedures: ? ?None Ordered ? ? ?Follow-Up: ?At [facility], you and your health needs are our priority.  As part of our continuing mission to provide you with exceptional heart care, we have created designated Provider Care T

## 2021-11-28 NOTE — Patient Instructions (Signed)
Medication Instructions:  ? ?Your physician recommends that you continue on your current medications as directed. Please refer to the Current Medication list given to you today. ? ?*If you need a refill on your cardiac medications before your next appointment, please call your pharmacy* ? ? ?Lab Work: ? ?None Ordered ? ?If you have labs (blood work) drawn today and your tests are completely normal, you will receive your results only by: ?MyChart Message (if you have MyChart) OR ?A paper copy in the mail ?If you have any lab test that is abnormal or we need to change your treatment, we will call you to review the results. ? ? ?Testing/Procedures: ? ?None Ordered ? ? ?Follow-Up: ?At [facility], you and your health needs are our priority.  As part of our continuing mission to provide you with exceptional heart care, we have created designated Provider Care Teams.  These Care Teams include your primary Cardiologist (physician) and Advanced Practice Providers (APPs -  Physician Assistants and Nurse Practitioners) who all work together to provide you with the care you need, when you need it. ? ?We recommend signing up for the patient portal called "MyChart".  Sign up information is provided on this After Visit Summary.  MyChart is used to connect with patients for Virtual Visits (Telemedicine).  Patients are able to view lab/test results, encounter notes, upcoming appointments, etc.  Non-urgent messages can be sent to your provider as well.   ?To learn more about what you can do with MyChart, go to [email or web address].   ? ?Your next appointment:  AS NEEDED ? ?Important Information About Sugar ? ? ? ? ? ? ?

## 2021-12-30 ENCOUNTER — Other Ambulatory Visit: Payer: Self-pay | Admitting: Family

## 2021-12-30 ENCOUNTER — Telehealth: Payer: Self-pay

## 2021-12-30 NOTE — Telephone Encounter (Signed)
Lm for patient to ask if she has had previous sleep study.  

## 2022-01-02 ENCOUNTER — Institutional Professional Consult (permissible substitution): Payer: BC Managed Care – PPO | Admitting: Primary Care

## 2022-01-02 NOTE — Telephone Encounter (Signed)
Will close encounter.  Appt will need to be rescheduled due to provider being out of office. Melissa will contact patient to reschedule.

## 2022-01-26 ENCOUNTER — Other Ambulatory Visit: Payer: Self-pay | Admitting: Family

## 2022-01-26 DIAGNOSIS — I10 Essential (primary) hypertension: Secondary | ICD-10-CM

## 2022-01-30 ENCOUNTER — Ambulatory Visit (INDEPENDENT_AMBULATORY_CARE_PROVIDER_SITE_OTHER): Payer: BC Managed Care – PPO | Admitting: Adult Health

## 2022-01-30 ENCOUNTER — Encounter: Payer: Self-pay | Admitting: Adult Health

## 2022-01-30 VITALS — BP 118/82 | HR 87 | Ht 62.0 in | Wt 118.8 lb

## 2022-01-30 DIAGNOSIS — R0683 Snoring: Secondary | ICD-10-CM | POA: Insufficient documentation

## 2022-01-30 NOTE — Patient Instructions (Signed)
Set up for home sleep study  Healthy sleep regimen  Do not drive if sleepy  Follow up in 6-8 weeks in [place] to review results and treatment plan .

## 2022-01-30 NOTE — Assessment & Plan Note (Signed)
Snoring, daytime sleepiness, high Epworth score all suspicious for underlying sleep apnea patient will be set up for home sleep study.  - discussed how weight can impact sleep and risk for sleep disordered breathing - discussed options to assist with weight loss: combination of diet modification, cardiovascular and strength training exercises   - had an extensive discussion regarding the adverse health consequences related to untreated sleep disordered breathing - specifically discussed the risks for hypertension, coronary artery disease, cardiac dysrhythmias, cerebrovascular disease, and diabetes - lifestyle modification discussed   - discussed how sleep disruption can increase risk of accidents, particularly when driving - safe driving practices were discussed    Plan  Patient Instructions  Set up for home sleep study  Healthy sleep regimen  Do not drive if sleepy  Follow up in 6-8 weeks in [place] to review results and treatment plan .

## 2022-01-30 NOTE — Progress Notes (Signed)
$'[name]$  ID: Katherine Sharp, female    DOB: 05-30-1966, 56 y.o.   MRN: [id number]  Chief Complaint  Patient presents with   Consult    Referring provider: Kate Sable, MD  HPI: 56 year old female seen for sleep consult January 30, 2022 for loud snoring and daytime sleepiness  TEST/EVENTS :   01/30/2022 Sleep consult  Patient presents today for a sleep consult.  Kindly referred by Dr. Garen Lah.  Patient complains that she has been snoring for a very long time.  Over the last few years feels that she is more tired during the daytime.  Her spouse has noticed that she has episodes where she stops breathing.  Patient has had a sleep study more than 10 years ago and was told that she did not have any sleep apnea but had snoring.  Weight is up about 10 pounds current weight is at 118 pounds with a BMI at 21.  Patient typically goes to bed about 9 to 10 PM.  Takes only about 30 minutes to go to sleep.  Is up about 2-3 times each night.  And gets up about 5 AM.  Also has teeth grinding and wears a mouthguard at nighttime.  No symptoms suspicious for sleep paralysis or cataplexy.  Patient does not take any sleep aids.  Has no history of congestive heart failure or CHF.  No removable dental work.  Takes in about 1 cup of coffee daily.  Epworth score is 14 out of 24.  Gets very sleepy if she sits down to watch TV or gets an active.  Says that she can fall asleep very easily.  Medical history is significant for hypertension, asthma, uterine cancer status post hysterectomy (no chemo or radiation), seasonal allergies, GERD, Lynch syndrome  Surgical history hysterectomy 2020, appendectomy 1983, breast implant removal April 2023  Social history patient is married.  Has adult children.  Is a first grade teacher.  Drinks 1 glass of wine nightly.  No tobacco no drug use.  Family history positive for brain, uterine, bladder, colon, breast cancer  Allergies  Allergen Reactions   Nsaids Nausea Only     Abdominal pains and indigestion    Immunization History  Administered Date(s) Administered   Influenza Inj Mdck Quad Pf 07/29/2017   Influenza,inj,Quad PF,6+ Mos 07/20/2016, 05/23/2018, 05/14/2019, 06/28/2021   Influenza-Unspecified 07/20/2016   Moderna Sars-Covid-2 Vaccination 10/06/2019, 11/03/2019, 11/11/2020    Past Medical History:  Diagnosis Date   ADHD (attention deficit hyperactivity disorder)    Asthma    per pt mild , does not have inhaler   Depression    History of migraine    History of ovarian cyst    2001 s/p RSO for hemorrhagic cyst   Hypertension    Wears glasses     Tobacco History: Social History   Tobacco Use  Smoking Status Never  Smokeless Tobacco Never   Counseling given: Not Answered   Outpatient Medications Prior to Visit  Medication Sig Dispense Refill   ALPRAZolam (XANAX) 0.25 MG tablet Take 0.25 mg by mouth 2 (two) times daily as needed.     amLODipine (NORVASC) 5 MG tablet TAKE 1 TABLET(5 MG) BY MOUTH DAILY 90 tablet 0   amphetamine-dextroamphetamine (ADDERALL XR) 15 MG 24 hr capsule Take by mouth every morning.     ascorbic acid (VITAMIN C) 100 MG tablet Take 100 mg by mouth daily.     calcium carbonate (TUMS - DOSED IN MG ELEMENTAL CALCIUM) 500 MG chewable tablet Chew 1  tablet by mouth as needed for indigestion or heartburn.     escitalopram (LEXAPRO) 20 MG tablet Take 1 tablet by mouth daily.     estradiol (ESTRACE) 0.1 MG/GM vaginal cream Place 1 g vaginally daily.      fluticasone (FLONASE) 50 MCG/ACT nasal spray Place 2 sprays into both nostrils daily as needed for allergies or rhinitis. 16 g 6   hydrocortisone 2.5 % lotion Apply topically 2 (two) times daily as needed.     lisinopril (ZESTRIL) 20 MG tablet Take 1 tablet (20 mg total) by mouth daily. 30 tablet 5   Multiple Vitamins-Minerals (MULTIVITAMIN ADULT PO) Take by mouth daily.     pantoprazole (PROTONIX) 40 MG tablet Take 20 mg by mouth daily.     zinc gluconate 50 MG tablet  Take 50 mg by mouth daily.     No facility-administered medications prior to visit.     Review of Systems:   Constitutional:   No  weight loss, night sweats,  Fevers, chills,  +fatigue, or  lassitude.  HEENT:   No headaches,  Difficulty swallowing,  Tooth/dental problems, or  Sore throat,                No sneezing, itching, ear ache, nasal congestion, post nasal drip,   CV:  No chest pain,  Orthopnea, PND, swelling in lower extremities, anasarca, dizziness, palpitations, syncope.   GI  No heartburn, indigestion, abdominal pain, nausea, vomiting, diarrhea, change in bowel habits, loss of appetite, bloody stools.   Resp: No shortness of breath with exertion or at rest.  No excess mucus, no productive cough,  No non-productive cough,  No coughing up of blood.  No change in color of mucus.  No wheezing.  No chest wall deformity  Skin: no rash or lesions.  GU: no dysuria, change in color of urine, no urgency or frequency.  No flank pain, no hematuria   MS:  No joint pain or swelling.  No decreased range of motion.  No back pain.    Physical Exam  BP 118/82 (BP Location: Left Arm, Cuff Size: Normal)   Pulse 87   Ht '5\' 2"'$  (1.575 m)   Wt 118 lb 12.8 oz (53.9 kg)   LMP 07/20/2016 (Exact Date)   SpO2 98%   BMI 21.73 kg/m   GEN: A/Ox3; pleasant , NAD, well nourished    HEENT:  [place]/AT,  EACs-clear, TMs-wnl, NOSE-clear, THROAT-clear, no lesions, no postnasal drip or exudate noted.  Class II-III MP airway  NECK:  Supple w/ fair ROM; no JVD; normal carotid impulses w/o bruits; no thyromegaly or nodules palpated; no lymphadenopathy.    RESP  Clear  P & A; w/o, wheezes/ rales/ or rhonchi. no accessory muscle use, no dullness to percussion  CARD:  RRR, no m/r/g, no peripheral edema, pulses intact, no cyanosis or clubbing.  GI:   Soft & nt; nml bowel sounds; no organomegaly or masses detected.   Musco: Warm bil, no deformities or joint swelling noted.   Neuro: alert, no focal  deficits noted.    Skin: Warm, no lesions or rashes     BMET       No data to display          No results found for: "NITRICOXIDE"      Assessment & Plan:   Snoring Snoring, daytime sleepiness, high Epworth score all suspicious for underlying sleep apnea patient will be set up for home sleep study.  - discussed how weight can  impact sleep and risk for sleep disordered breathing - discussed options to assist with weight loss: combination of diet modification, cardiovascular and strength training exercises   - had an extensive discussion regarding the adverse health consequences related to untreated sleep disordered breathing - specifically discussed the risks for hypertension, coronary artery disease, cardiac dysrhythmias, cerebrovascular disease, and diabetes - lifestyle modification discussed   - discussed how sleep disruption can increase risk of accidents, particularly when driving - safe driving practices were discussed    Plan  Patient Instructions  Set up for home sleep study  Healthy sleep regimen  Do not drive if sleepy  Follow up in 6-8 weeks in [place] to review results and treatment plan .         Rexene Edison, NP 01/30/2022

## 2022-01-31 NOTE — Progress Notes (Signed)
Reviewed and agree with assessment/plan.   [name], MD [facility] Pulmonary/Critical Care 01/31/2022, 1:11 PM Pager:  [phone]

## 2022-04-21 ENCOUNTER — Other Ambulatory Visit: Payer: Self-pay | Admitting: Family

## 2022-04-21 DIAGNOSIS — I10 Essential (primary) hypertension: Secondary | ICD-10-CM

## 2022-04-24 ENCOUNTER — Other Ambulatory Visit: Payer: Self-pay

## 2022-04-24 DIAGNOSIS — I10 Essential (primary) hypertension: Secondary | ICD-10-CM

## 2022-04-24 MED ORDER — LISINOPRIL 20 MG PO TABS: 20.0000 mg | ORAL_TABLET | Freq: Every day | ORAL | 5 refills | Status: DC

## 2022-05-04 ENCOUNTER — Encounter: Payer: Self-pay | Admitting: Adult Health

## 2022-05-12 ENCOUNTER — Telehealth: Payer: Self-pay | Admitting: Adult Health

## 2022-05-12 NOTE — Telephone Encounter (Signed)
Katherine Sharp - pt wants to come to [place] - will you please contact the pt to schedule?   Thanks!

## 2022-05-12 NOTE — Telephone Encounter (Signed)
Pt called the office stating that she was returning a call about getting appt scheduled for HST. 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  Routing to PCCs.

## 2022-05-15 NOTE — Telephone Encounter (Signed)
I have spoke with Mrs. Bifulco and she has been scheduled to pick up HST machine on 05/24/22 @ 4:30pm here in [place]

## 2022-05-24 ENCOUNTER — Ambulatory Visit: Payer: BC Managed Care – PPO | Admitting: Adult Health

## 2022-05-24 DIAGNOSIS — R0683 Snoring: Secondary | ICD-10-CM

## 2022-05-24 DIAGNOSIS — G4733 Obstructive sleep apnea (adult) (pediatric): Secondary | ICD-10-CM

## 2022-06-02 DIAGNOSIS — G4733 Obstructive sleep apnea (adult) (pediatric): Secondary | ICD-10-CM | POA: Diagnosis not present

## 2022-06-05 NOTE — Progress Notes (Signed)
Called and spoke with patient, provided results/recommendations per Rexene Edison NP.  Scheduled virtual f/u for 06/16/22 at 4 pm, advised to log on by 3:45 pm.  She verbalized understanding.  Nothing further needed.

## 2022-06-16 ENCOUNTER — Telehealth (INDEPENDENT_AMBULATORY_CARE_PROVIDER_SITE_OTHER): Payer: BC Managed Care – PPO | Admitting: Adult Health

## 2022-06-16 DIAGNOSIS — G4733 Obstructive sleep apnea (adult) (pediatric): Secondary | ICD-10-CM

## 2022-06-16 DIAGNOSIS — R0683 Snoring: Secondary | ICD-10-CM | POA: Diagnosis not present

## 2022-06-16 NOTE — Patient Instructions (Addendum)
Begin CPAP At bedtime, wear all night long for at least 6hrs or more  Healthy sleep regimen  Do not drive if sleepy  Dream wear nasal mask  Saline nasal spray Twice daily   Saline nasal gel At bedtime   Follow up in 3 months and As needed

## 2022-06-20 NOTE — Progress Notes (Signed)
Virtual Visit via Video Note  I connected with Katherine Sharp on 06/20/22 at  4:00 PM EDT by a video enabled telemedicine application and verified that I am speaking with the correct person using two identifiers.  Location: Patient: Home  Provider: Office    I discussed the limitations of evaluation and management by telemedicine and the availability of in person appointments. The patient expressed understanding and agreed to proceed.  History of Present Illness: 56 year old female seen for sleep consult January 30, 2022 for snoring and daytime sleepiness found to have moderate obstructive sleep apnea  Today's video visit is a follow-up visit.  Patient was seen for sleep consult June 2023 for snoring daytime sleepiness.  She was set up for home sleep study was completed on May 24, 2022 that showed moderate sleep apnea with AHI 23.7/hour and SPO2 low at 81%.  We discussed her sleep study results.  Went over treatment options including weight loss, oral appliance and CPAP.  Patient like to proceed with CPAP therapy.   Observations/Objective: Home sleep study May 24, 2022 showed moderate obstructive sleep apnea with AHI 23.7/hour and SPO2 low at 81%.   Assessment and Plan: Moderate obstructive sleep apnea.  Patient begin CPAP AutoSet 5 to 15 cm H2O.  Wants to try the DreamWear nasal mask.  Plan  Patient Instructions  Begin CPAP At bedtime, wear all night long for at least 6hrs or more  Healthy sleep regimen  Do not drive if sleepy  Dream wear nasal mask  Saline nasal spray Twice daily   Saline nasal gel At bedtime   Follow up in 3 months and As needed      Follow Up Instructions:    I discussed the assessment and treatment plan with the patient. The patient was provided an opportunity to ask questions and all were answered. The patient agreed with the plan and demonstrated an understanding of the instructions.   The patient was advised to call back or seek an in-person  evaluation if the symptoms worsen or if the condition fails to improve as anticipated.  I provided 20 minutes of non-face-to-face time during this encounter.   Rexene Edison, NP

## 2022-06-21 NOTE — Progress Notes (Signed)
Reviewed and agree with assessment/plan.   [name], MD [facility] Pulmonary/Critical Care 06/21/2022, 8:13 AM Pager:  [phone]

## 2022-07-24 ENCOUNTER — Other Ambulatory Visit: Payer: Self-pay | Admitting: Family

## 2022-07-24 DIAGNOSIS — I10 Essential (primary) hypertension: Secondary | ICD-10-CM

## 2022-08-02 ENCOUNTER — Other Ambulatory Visit: Payer: Self-pay | Admitting: Family

## 2022-08-02 DIAGNOSIS — I10 Essential (primary) hypertension: Secondary | ICD-10-CM

## 2022-08-11 ENCOUNTER — Encounter: Payer: Self-pay | Admitting: Cardiology

## 2022-08-11 ENCOUNTER — Telehealth: Payer: Self-pay | Admitting: Family

## 2022-08-11 DIAGNOSIS — I10 Essential (primary) hypertension: Secondary | ICD-10-CM

## 2022-08-15 NOTE — Telephone Encounter (Signed)
Pt need a refill on amLODipine sent to [facility] and pt may think she has a bladder infection and would like to drop off a specimen. Pt would like to be called

## 2022-08-17 ENCOUNTER — Other Ambulatory Visit: Payer: Self-pay | Admitting: Family

## 2022-08-17 DIAGNOSIS — I10 Essential (primary) hypertension: Secondary | ICD-10-CM

## 2022-08-17 MED ORDER — AMLODIPINE BESYLATE 5 MG PO TABS: ORAL_TABLET | ORAL | 0 refills | Status: DC

## 2022-08-17 NOTE — Addendum Note (Signed)
Addended by: [place], Lorelee Mclaurin on: 08/17/2022 01:47 PM   Modules accepted: Orders

## 2022-08-21 ENCOUNTER — Ambulatory Visit: Payer: BC Managed Care – PPO | Admitting: Family

## 2022-08-21 ENCOUNTER — Encounter: Payer: Self-pay | Admitting: Family

## 2022-08-21 VITALS — BP 151/100 | HR 85 | Temp 98.3°F | Ht 62.0 in | Wt 116.6 lb

## 2022-08-21 DIAGNOSIS — I10 Essential (primary) hypertension: Secondary | ICD-10-CM

## 2022-08-21 DIAGNOSIS — R300 Dysuria: Secondary | ICD-10-CM | POA: Diagnosis not present

## 2022-08-21 DIAGNOSIS — R102 Pelvic and perineal pain: Secondary | ICD-10-CM | POA: Diagnosis not present

## 2022-08-21 LAB — POC URINALSYSI DIPSTICK (AUTOMATED)
Bilirubin, UA: NEGATIVE
Blood, UA: NEGATIVE
Glucose, UA: NEGATIVE
Ketones, UA: NEGATIVE
Leukocytes, UA: NEGATIVE
Nitrite, UA: NEGATIVE
Protein, UA: NEGATIVE
Spec Grav, UA: 1.02 (ref 1.010–1.025)
Urobilinogen, UA: 0.2 E.U./dL
pH, UA: 7 (ref 5.0–8.0)

## 2022-08-21 NOTE — Patient Instructions (Addendum)
We will await urine studies.   Please increase amlodipine '10mg'$ .  Nice to see you.

## 2022-08-21 NOTE — Progress Notes (Unsigned)
Assessment & Plan:  Hypertension, unspecified type Assessment & Plan: Elevated.  Advised patient to increase amlodipine from 5 mg to 10 mg.  Close follow-up.   Dysuria -     POCT Urinalysis Dipstick (Automated) -     Urine Culture -     Urinalysis, Routine w reflex microscopic  Essential hypertension -     amLODIPine Besylate; Take 1 tablet (10 mg total) by mouth daily.  Dispense: 90 tablet; Refill: 3  Suprapubic pain Assessment & Plan: Patient well-appearing, nontoxic in appearance.  Suprapubic tenderness on exam.  Urine point-of-care is bland.  Differential includes UTI, OAB, stress incontinence, interstitial cystitis and diverticulitis.   Of note, history of endometrial cancer, Lynch syndrome.  Of note, after hysterectomy, patient has suffered from scar tissue at vaginal apex .Question if pain related to scar tissue.  Pending urine culture, urinalysis.  Discussed history of diverticula seen on colonoscopy.  Patient politely declines CT abdomen and pelvis at this time to evaluate for diverticulitis.  Will await urine studies. Patient will arrange pelvic floor PT as previously referred by [facility] oncology. Consider dedicated urogyn consult; she follows with [facility].  Annual colonoscopy due 02/2023 . close follow up.       Return precautions given.   Risks, benefits, and alternatives of the medications and treatment plan prescribed today were discussed, and patient expressed understanding.   Education regarding symptom management and diagnosis given to patient on AVS either electronically or printed.  Return in about 1 week (around 08/28/2022).  Mable Paris, FNP  Subjective:    Patient ID: Katherine Sharp, female    DOB: [birth year], 57 y.o.   MRN: [id number]  CC: Katherine Sharp is a 57 y.o. female who presents today for an acute visit.    HPI: Complains of lower abdominal pain, and 'bladder spasms' for several weeks. She describes rolling over and feeling lower abdominal pain  and couldn't 'hold her urine'.  No constipation, nausea, fever.   Endorses urinary frequency, urinary urgency.   Describes strong smelling urine She is drinking more water  She is a Pharmacist, hospital  Colonscopy 02/27/2022, diverticula, colon polyps.    H/o hysterectomy, oophorectomy, appendectomy  She also notes that blood pressure has been running elevated at home.  She is compliant with amlodipine 5 mg.       Colonoscopy due 02/2022  Mammogram is due; she follows with [facility]. She is no longer following with [employer].   Follows with [facility] in [place].    H/o lynch syndrome History of hysterectomy, appendectomy, bilateral oophorectomy.  Allergies: Nsaids Current Outpatient Medications on File Prior to Visit  Medication Sig Dispense Refill   albuterol (VENTOLIN HFA) 108 (90 Base) MCG/ACT inhaler Inhale 2 puffs into the lungs every 6 (six) hours as needed.     ALPRAZolam (XANAX) 0.25 MG tablet Take 0.25 mg by mouth 2 (two) times daily as needed.     amphetamine-dextroamphetamine (ADDERALL XR) 15 MG 24 hr capsule Take by mouth every morning.     calcium carbonate (TUMS - DOSED IN MG ELEMENTAL CALCIUM) 500 MG chewable tablet Chew 1 tablet by mouth as needed for indigestion or heartburn.     escitalopram (LEXAPRO) 20 MG tablet Take 1 tablet by mouth daily.     fluticasone (FLONASE) 50 MCG/ACT nasal spray Place 2 sprays into both nostrils daily as needed for allergies or rhinitis. 16 g 6   hydrocortisone 2.5 % lotion Apply topically 2 (two) times daily as  needed.     lisinopril (ZESTRIL) 20 MG tablet Take 1 tablet (20 mg total) by mouth daily. 30 tablet 5   Multiple Vitamins-Minerals (MULTIVITAMIN ADULT PO) Take by mouth daily.     pantoprazole (PROTONIX) 40 MG tablet Take 20 mg by mouth daily.     No current facility-administered medications on file prior to visit.    Review of Systems  Constitutional:  Negative for chills and fever.  Respiratory:  Negative for  cough.   Cardiovascular:  Negative for chest pain and palpitations.  Gastrointestinal:  Positive for abdominal pain. Negative for nausea and vomiting.  Genitourinary:  Positive for urgency. Negative for difficulty urinating.      Objective:    BP (!) 151/100   Pulse 85   Temp 98.3 F (36.8 C)   Ht '5\' 2"'$  (1.575 m)   Wt 116 lb 9.6 oz (52.9 kg)   LMP 07/20/2016 (Exact Date)   SpO2 96%   BMI 21.33 kg/m   BP Readings from Last 3 Encounters:  08/21/22 (!) 151/100  01/30/22 118/82  11/28/21 100/70   Wt Readings from Last 3 Encounters:  08/21/22 116 lb 9.6 oz (52.9 kg)  01/30/22 118 lb 12.8 oz (53.9 kg)  11/28/21 119 lb (54 kg)    Physical Exam Vitals reviewed.  Constitutional:      Appearance: Normal appearance. She is well-developed.  Eyes:     Conjunctiva/sclera: Conjunctivae normal.  Cardiovascular:     Rate and Rhythm: Normal rate and regular rhythm.     Pulses: Normal pulses.     Heart sounds: Normal heart sounds.  Pulmonary:     Effort: Pulmonary effort is normal.     Breath sounds: Normal breath sounds. No wheezing, rhonchi or rales.  Abdominal:     General: Bowel sounds are normal. There is no distension.     Palpations: Abdomen is soft. Abdomen is not rigid. There is no fluid wave or mass.     Tenderness: There is abdominal tenderness in the suprapubic area. There is no right CVA tenderness, left CVA tenderness, guarding or rebound.     Comments: Suprapubic tenderness diffusely on exam.  Skin:    General: Skin is warm and dry.  Neurological:     Mental Status: She is alert.  Psychiatric:        Speech: Speech normal.        Behavior: Behavior normal.        Thought Content: Thought content normal.

## 2022-08-22 ENCOUNTER — Encounter: Payer: Self-pay | Admitting: Family

## 2022-08-22 DIAGNOSIS — R102 Pelvic and perineal pain: Secondary | ICD-10-CM | POA: Insufficient documentation

## 2022-08-22 LAB — URINE CULTURE
MICRO NUMBER:: 14401864
Result:: NO GROWTH
SPECIMEN QUALITY:: ADEQUATE

## 2022-08-22 LAB — URINALYSIS, ROUTINE W REFLEX MICROSCOPIC
Bilirubin Urine: NEGATIVE
Hgb urine dipstick: NEGATIVE
Ketones, ur: NEGATIVE
Leukocytes,Ua: NEGATIVE
Nitrite: NEGATIVE
RBC / HPF: NONE SEEN (ref 0–?)
Specific Gravity, Urine: 1.015 (ref 1.000–1.030)
Total Protein, Urine: NEGATIVE
Urine Glucose: NEGATIVE
Urobilinogen, UA: 0.2 (ref 0.0–1.0)
WBC, UA: NONE SEEN (ref 0–?)
pH: 7 (ref 5.0–8.0)

## 2022-08-22 MED ORDER — AMLODIPINE BESYLATE 10 MG PO TABS: 10.0000 mg | ORAL_TABLET | Freq: Every day | ORAL | 3 refills | Status: DC

## 2022-08-22 NOTE — Assessment & Plan Note (Signed)
Elevated.  Advised patient to increase amlodipine from 5 mg to 10 mg.  Close follow-up.

## 2022-08-22 NOTE — Assessment & Plan Note (Addendum)
Patient well-appearing, nontoxic in appearance.  Suprapubic tenderness on exam.  Urine point-of-care is bland.  Differential includes UTI, OAB, stress incontinence, interstitial cystitis and diverticulitis.   Of note, history of endometrial cancer, Lynch syndrome.  Of note, after hysterectomy, patient has suffered from scar tissue at vaginal apex .Question if pain related to scar tissue.  Pending urine culture, urinalysis.  Discussed history of diverticula seen on colonoscopy.  Patient politely declines CT abdomen and pelvis at this time to evaluate for diverticulitis.  Will await urine studies. Patient will arrange pelvic floor PT as previously referred by [facility] oncology. Consider dedicated urogyn consult; she follows with [facility].  Annual colonoscopy due 02/2023 . close follow up.

## 2022-08-31 ENCOUNTER — Telehealth: Payer: Self-pay

## 2022-08-31 NOTE — Telephone Encounter (Signed)
LVM to call back to go results

## 2022-09-01 ENCOUNTER — Ambulatory Visit: Payer: BC Managed Care – PPO | Admitting: Family

## 2022-09-01 ENCOUNTER — Encounter: Payer: Self-pay | Admitting: Family

## 2022-09-01 VITALS — BP 130/78 | HR 87 | Temp 97.9°F | Ht 62.0 in | Wt 118.2 lb

## 2022-09-01 DIAGNOSIS — R051 Acute cough: Secondary | ICD-10-CM

## 2022-09-01 DIAGNOSIS — H66002 Acute suppurative otitis media without spontaneous rupture of ear drum, left ear: Secondary | ICD-10-CM | POA: Diagnosis not present

## 2022-09-01 DIAGNOSIS — K219 Gastro-esophageal reflux disease without esophagitis: Secondary | ICD-10-CM | POA: Insufficient documentation

## 2022-09-01 DIAGNOSIS — I10 Essential (primary) hypertension: Secondary | ICD-10-CM | POA: Diagnosis not present

## 2022-09-01 DIAGNOSIS — H6690 Otitis media, unspecified, unspecified ear: Secondary | ICD-10-CM | POA: Insufficient documentation

## 2022-09-01 DIAGNOSIS — R102 Pelvic and perineal pain: Secondary | ICD-10-CM

## 2022-09-01 LAB — POCT INFLUENZA A/B
Influenza A, POC: NEGATIVE
Influenza B, POC: NEGATIVE

## 2022-09-01 LAB — POCT RAPID STREP A (OFFICE): Rapid Strep A Screen: NEGATIVE

## 2022-09-01 LAB — POC COVID19 BINAXNOW: SARS Coronavirus 2 Ag: NEGATIVE

## 2022-09-01 MED ORDER — FLUCONAZOLE 150 MG PO TABS: 150.0000 mg | ORAL_TABLET | Freq: Once | ORAL | 1 refills | Status: AC

## 2022-09-01 MED ORDER — PANTOPRAZOLE SODIUM 20 MG PO TBEC: 20.0000 mg | DELAYED_RELEASE_TABLET | Freq: Every day | ORAL | 3 refills | Status: DC

## 2022-09-01 MED ORDER — AMOXICILLIN-POT CLAVULANATE 875-125 MG PO TABS: 1.0000 | ORAL_TABLET | Freq: Two times a day (BID) | ORAL | 0 refills | Status: AC

## 2022-09-01 NOTE — Patient Instructions (Signed)
If you continue see blood in your sputum, please pause the flonase.

## 2022-09-01 NOTE — Progress Notes (Signed)
Assessment & Plan:  Non-recurrent acute suppurative otitis media of left ear without spontaneous rupture of tympanic membrane Assessment & Plan: Based on duration of symptoms, suspect viral in nature.  Patient is negative for strep, COVID and flu today.  Advised her to try Mucinex over-the-counter.  Advised as needed use of albuterol inhaler.  if no improvement of symptoms, I provided her with Augmentin to start.  She understands to take with probiotic to avoid a yeast infection. If she does have a have symptoms of yeast infection, have given her Diflucan if needed  Orders: -     Amoxicillin-Pot Clavulanate; Take 1 tablet by mouth 2 (two) times daily for 7 days.  Dispense: 14 tablet; Refill: 0 -     Fluconazole; Take 1 tablet (150 mg total) by mouth once for 1 dose. Take one tablet PO once. If sxs persist, may take one tablet PO 3 days later.  Dispense: 2 tablet; Refill: 1  Acute cough -     POC COVID-19 BinaxNow -     POCT Influenza A/B -     POCT rapid strep A  Gastroesophageal reflux disease, unspecified whether esophagitis present -     Pantoprazole Sodium; Take 1 tablet (20 mg total) by mouth daily.  Dispense: 90 tablet; Refill: 3  Hypertension, unspecified type Assessment & Plan: Significantly improved.  Continue amlodipine 10 mg qd   Suprapubic pain Assessment & Plan: No recurrent episode.  Patient is comfortable with continuing close vigilance.  Advised  if any recurrence, I would have a low threshold for CT abdomen/pelvis.       Return precautions given.   Risks, benefits, and alternatives of the medications and treatment plan prescribed today were discussed, and patient expressed understanding.   Education regarding symptom management and diagnosis given to patient on AVS either electronically or printed.  Return in about 6 months (around 03/02/2023).  Mable Paris, FNP  Subjective:    Patient ID: Katherine Sharp, female    DOB: [birth year], 57 y.o.   MRN:  [id number]  CC: [name] is a 57 y.o. female who presents today for follow up.   HPI: Complains of thick yellow nasal congestion x 3 days. Tinge of blood in sputum.   Left sided of throat has been sore, sinus pressure, left ear pain. Occasional cough, wheeze.   She is compliant with cipap.   No bodyaches,fever, sob  She is taking ibuprofen, vit D and zinc.   Pelvic pain has resolved. She has not had an episode in one month.        Reports yeast infection with antibiotics  Urine culture negative, urine analysis negative for blood 08/21/22  HTN- Compliant with amlodipine '10mg'$ . HAs have resolved.    Allergies: Nsaids Current Outpatient Medications on File Prior to Visit  Medication Sig Dispense Refill   albuterol (VENTOLIN HFA) 108 (90 Base) MCG/ACT inhaler Inhale 2 puffs into the lungs every 6 (six) hours as needed.     ALPRAZolam (XANAX) 0.25 MG tablet Take 0.25 mg by mouth 2 (two) times daily as needed.     amLODipine (NORVASC) 10 MG tablet Take 1 tablet (10 mg total) by mouth daily. 90 tablet 3   amphetamine-dextroamphetamine (ADDERALL XR) 15 MG 24 hr capsule Take by mouth every morning.     calcium carbonate (TUMS - DOSED IN MG ELEMENTAL CALCIUM) 500 MG chewable tablet Chew 1 tablet by mouth as needed for indigestion or heartburn.     escitalopram (LEXAPRO) 20 MG  tablet Take 1 tablet by mouth daily.     fluticasone (FLONASE) 50 MCG/ACT nasal spray Place 2 sprays into both nostrils daily as needed for allergies or rhinitis. 16 g 6   hydrocortisone 2.5 % lotion Apply topically 2 (two) times daily as needed.     lisinopril (ZESTRIL) 20 MG tablet Take 1 tablet (20 mg total) by mouth daily. 30 tablet 5   Multiple Vitamins-Minerals (MULTIVITAMIN ADULT PO) Take by mouth daily.     No current facility-administered medications on file prior to visit.    Review of Systems  Constitutional:  Negative for chills and fever.  HENT:  Positive for congestion, ear pain and sinus  pressure.   Respiratory:  Positive for cough and wheezing. Negative for shortness of breath.   Cardiovascular:  Negative for chest pain and palpitations.  Gastrointestinal:  Negative for nausea and vomiting.      Objective:    BP 130/78   Pulse 87   Temp 97.9 F (36.6 C) (Oral)   Ht '5\' 2"'$  (1.575 m)   Wt 118 lb 3.2 oz (53.6 kg)   LMP 07/20/2016 (Exact Date)   SpO2 97%   BMI 21.62 kg/m  BP Readings from Last 3 Encounters:  09/01/22 130/78  08/21/22 (!) 151/100  01/30/22 118/82   Wt Readings from Last 3 Encounters:  09/01/22 118 lb 3.2 oz (53.6 kg)  08/21/22 116 lb 9.6 oz (52.9 kg)  01/30/22 118 lb 12.8 oz (53.9 kg)    Physical Exam Vitals reviewed.  Constitutional:      Appearance: She is well-developed.  HENT:     Head: Normocephalic and atraumatic.     Right Ear: Hearing, tympanic membrane, ear canal and external ear normal. No decreased hearing noted. No drainage, swelling or tenderness. No middle ear effusion. No foreign body. Tympanic membrane is not erythematous or bulging.     Left Ear: Hearing, ear canal and external ear normal. No decreased hearing noted. Tenderness present. No drainage or swelling.  No middle ear effusion. No foreign body. Tympanic membrane is erythematous. Tympanic membrane is not bulging.     Nose: Nose normal. No rhinorrhea.     Right Sinus: No maxillary sinus tenderness or frontal sinus tenderness.     Left Sinus: No maxillary sinus tenderness or frontal sinus tenderness.     Mouth/Throat:     Pharynx: Uvula midline. No oropharyngeal exudate or posterior oropharyngeal erythema.     Tonsils: No tonsillar abscesses.  Eyes:     Conjunctiva/sclera: Conjunctivae normal.  Cardiovascular:     Rate and Rhythm: Regular rhythm.     Pulses: Normal pulses.     Heart sounds: Normal heart sounds.  Pulmonary:     Effort: Pulmonary effort is normal.     Breath sounds: Normal breath sounds. No wheezing, rhonchi or rales.  Lymphadenopathy:     Head:      Right side of head: No submental, submandibular, tonsillar, preauricular, posterior auricular or occipital adenopathy.     Left side of head: No submental, submandibular, tonsillar, preauricular, posterior auricular or occipital adenopathy.     Cervical: No cervical adenopathy.  Skin:    General: Skin is warm and dry.  Neurological:     Mental Status: She is alert.  Psychiatric:        Speech: Speech normal.        Behavior: Behavior normal.        Thought Content: Thought content normal.

## 2022-09-01 NOTE — Assessment & Plan Note (Signed)
No recurrent episode.  Patient is comfortable with continuing close vigilance.  Advised  if any recurrence, I would have a low threshold for CT abdomen/pelvis.

## 2022-09-01 NOTE — Assessment & Plan Note (Addendum)
Based on duration of symptoms, suspect viral in nature.  Patient is negative for strep, COVID and flu today.  Advised her to try Mucinex over-the-counter.  Advised as needed use of albuterol inhaler.  if no improvement of symptoms, I provided her with Augmentin to start.  She understands to take with probiotic to avoid a yeast infection. If she does have a have symptoms of yeast infection, have given her Diflucan if needed

## 2022-09-01 NOTE — Assessment & Plan Note (Signed)
Significantly improved.  Continue amlodipine 10 mg qd

## 2022-09-06 ENCOUNTER — Encounter: Payer: Self-pay | Admitting: Family

## 2022-12-03 ENCOUNTER — Other Ambulatory Visit: Payer: Self-pay | Admitting: Family

## 2022-12-03 DIAGNOSIS — J0121 Acute recurrent ethmoidal sinusitis: Secondary | ICD-10-CM

## 2023-01-29 ENCOUNTER — Ambulatory Visit: Payer: BC Managed Care – PPO | Admitting: Family

## 2023-01-29 VITALS — BP 132/76 | HR 86 | Temp 97.9°F | Ht 62.0 in | Wt 118.6 lb

## 2023-01-29 DIAGNOSIS — Z23 Encounter for immunization: Secondary | ICD-10-CM | POA: Diagnosis not present

## 2023-01-29 DIAGNOSIS — Z136 Encounter for screening for cardiovascular disorders: Secondary | ICD-10-CM

## 2023-01-29 DIAGNOSIS — Z13220 Encounter for screening for lipoid disorders: Secondary | ICD-10-CM | POA: Diagnosis not present

## 2023-01-29 DIAGNOSIS — Z1159 Encounter for screening for other viral diseases: Secondary | ICD-10-CM

## 2023-01-29 DIAGNOSIS — Z8639 Personal history of other endocrine, nutritional and metabolic disease: Secondary | ICD-10-CM

## 2023-01-29 DIAGNOSIS — I10 Essential (primary) hypertension: Secondary | ICD-10-CM

## 2023-01-29 DIAGNOSIS — Z111 Encounter for screening for respiratory tuberculosis: Secondary | ICD-10-CM

## 2023-01-29 DIAGNOSIS — Z1231 Encounter for screening mammogram for malignant neoplasm of breast: Secondary | ICD-10-CM

## 2023-01-29 MED ORDER — LISINOPRIL 20 MG PO TABS
20.0000 mg | ORAL_TABLET | Freq: Every day | ORAL | 3 refills | Status: DC
Start: 2023-01-29 — End: 2024-02-20

## 2023-01-29 NOTE — Assessment & Plan Note (Signed)
Chronic, stable.  Continue  amlodipine 10 mg daily, lisinopril 20 mg daily

## 2023-01-29 NOTE — Progress Notes (Signed)
PPD Placement note Katherine Sharp, 57 y.o. female is here today for placement of PPD test Reason for PPD test: work Pt taken PPD test before: yes Verified in allergy area and with patient that they are not allergic to the products PPD is made of (Phenol or Tween). Yes Is patient taking any oral or IV steroid medication now or have they taken it in the last month? no Has the patient ever received the BCG vaccine?: no Has the patient been in recent contact with anyone known or suspected of having active TB disease?: no      Date of exposure (if applicable): na       Name of person they were exposed to (if applicable): na Patient's Country of origin?: NA O: Alert and oriented in NAD. P:  PPD placed on 01/29/2023.  Patient advised to return for reading within 48-72 hours.

## 2023-01-29 NOTE — Patient Instructions (Signed)
Nice to see you!   

## 2023-01-29 NOTE — Progress Notes (Signed)
Assessment & Plan:  Screening for tuberculosis -     TB Skin Test  History of vitamin D deficiency -     VITAMIN D 25 Hydroxy (Vit-D Deficiency, Fractures); Future  Encounter for lipid screening for cardiovascular disease -     Lipid panel; Future  Hypertension, unspecified type Assessment & Plan: Chronic, stable.  Continue  amlodipine 10 mg daily, lisinopril 20 mg daily  Orders: -     CBC with Differential/Platelet; Future -     Comprehensive metabolic panel; Future -     TSH; Future  Encounter for screening mammogram for malignant neoplasm of breast -     3D Screening Mammogram, Left and Right; Future  Primary hypertension Assessment & Plan: Chronic, stable.  Continue  amlodipine 10 mg daily, lisinopril 20 mg daily  Orders: -     Lisinopril; Take 1 tablet (20 mg total) by mouth daily.  Dispense: 90 tablet; Refill: 3  Encounter for hepatitis C screening test for low risk patient -     Hepatitis C antibody; Future  Need for hepatitis B screening test -     Hepatitis B surface antibody,quantitative; Future  Measles screening -     Measles/Mumps/Rubella Immunity; Future  Need for diphtheria-tetanus-pertussis (Tdap) vaccine -     Tdap vaccine greater than or equal to 57yo IM     Return precautions given.   Risks, benefits, and alternatives of the medications and treatment plan prescribed today were discussed, and patient expressed understanding.   Education regarding symptom management and diagnosis given to patient on AVS either electronically or printed.  Return in about 1 year (around 01/29/2024).  Rennie Plowman, FNP  Subjective:    Patient ID: Glenna Durand, female    DOB: Dec 31, 1965, 57 y.o.   MRN: [id number]  CC: AUDRIE MAUTHE is a 57 y.o. female who presents today for follow up.   HPI: Feels well today  No new complaints  Compliant with amlodipine 10 mg daily, lisinopril 20 mg daily.  Doing well regimen.  No chest pain, dizziness    Due  Tdap Colonoscopy is scheduled  Allergies: Nsaids Current Outpatient Medications on File Prior to Visit  Medication Sig Dispense Refill   albuterol (VENTOLIN HFA) 108 (90 Base) MCG/ACT inhaler Inhale 2 puffs into the lungs every 6 (six) hours as needed.     ALPRAZolam (XANAX) 0.25 MG tablet Take 0.25 mg by mouth 2 (two) times daily as needed.     amLODipine (NORVASC) 10 MG tablet Take 1 tablet (10 mg total) by mouth daily. 90 tablet 3   amphetamine-dextroamphetamine (ADDERALL XR) 15 MG 24 hr capsule Take by mouth every morning.     calcium carbonate (TUMS - DOSED IN MG ELEMENTAL CALCIUM) 500 MG chewable tablet Chew 1 tablet by mouth as needed for indigestion or heartburn.     escitalopram (LEXAPRO) 20 MG tablet Take 1 tablet by mouth daily.     fluticasone (FLONASE) 50 MCG/ACT nasal spray SHAKE LIQUID AND USE 2 SPRAYS IN EACH NOSTRIL DAILY AS NEEDED FOR ALLERGIES OR RHINITIS 16 g 6   hydrocortisone 2.5 % lotion Apply topically 2 (two) times daily as needed.     Multiple Vitamins-Minerals (MULTIVITAMIN ADULT PO) Take by mouth daily.     pantoprazole (PROTONIX) 20 MG tablet Take 1 tablet (20 mg total) by mouth daily. 90 tablet 3   No current facility-administered medications on file prior to visit.    Review of Systems  Constitutional:  Negative for chills  and fever.  Respiratory:  Negative for cough.   Cardiovascular:  Negative for chest pain and palpitations.  Gastrointestinal:  Negative for nausea and vomiting.      Objective:    BP 132/76   Pulse 86   Temp 97.9 F (36.6 C) (Oral)   Ht 5\' 2"  (1.575 m)   Wt 118 lb 9.6 oz (53.8 kg)   LMP 07/20/2016 (Exact Date)   SpO2 96%   BMI 21.69 kg/m  BP Readings from Last 3 Encounters:  01/29/23 132/76  09/01/22 130/78  08/21/22 (!) 151/100   Wt Readings from Last 3 Encounters:  01/29/23 118 lb 9.6 oz (53.8 kg)  09/01/22 118 lb 3.2 oz (53.6 kg)  08/21/22 116 lb 9.6 oz (52.9 kg)    Physical Exam Vitals reviewed.  Constitutional:       Appearance: She is well-developed.  Eyes:     Conjunctiva/sclera: Conjunctivae normal.  Cardiovascular:     Rate and Rhythm: Normal rate and regular rhythm.     Pulses: Normal pulses.     Heart sounds: Normal heart sounds.  Pulmonary:     Effort: Pulmonary effort is normal.     Breath sounds: Normal breath sounds. No wheezing, rhonchi or rales.  Skin:    General: Skin is warm and dry.  Neurological:     Mental Status: She is alert.  Psychiatric:        Speech: Speech normal.        Behavior: Behavior normal.        Thought Content: Thought content normal.

## 2023-02-01 ENCOUNTER — Ambulatory Visit (INDEPENDENT_AMBULATORY_CARE_PROVIDER_SITE_OTHER): Payer: BC Managed Care – PPO | Admitting: *Deleted

## 2023-02-01 DIAGNOSIS — I10 Essential (primary) hypertension: Secondary | ICD-10-CM | POA: Diagnosis not present

## 2023-02-01 DIAGNOSIS — Z8639 Personal history of other endocrine, nutritional and metabolic disease: Secondary | ICD-10-CM

## 2023-02-01 DIAGNOSIS — Z136 Encounter for screening for cardiovascular disorders: Secondary | ICD-10-CM

## 2023-02-01 DIAGNOSIS — Z13220 Encounter for screening for lipoid disorders: Secondary | ICD-10-CM

## 2023-02-01 DIAGNOSIS — Z1159 Encounter for screening for other viral diseases: Secondary | ICD-10-CM

## 2023-02-01 LAB — CBC WITH DIFFERENTIAL/PLATELET
Basophils Absolute: 0 10*3/uL (ref 0.0–0.1)
Basophils Relative: 0.9 % (ref 0.0–3.0)
Eosinophils Absolute: 0.2 10*3/uL (ref 0.0–0.7)
Eosinophils Relative: 5.8 % — ABNORMAL HIGH (ref 0.0–5.0)
HCT: 39.4 % (ref 36.0–46.0)
Hemoglobin: 13.1 g/dL (ref 12.0–15.0)
Lymphocytes Relative: 41.1 % (ref 12.0–46.0)
Lymphs Abs: 1.4 10*3/uL (ref 0.7–4.0)
MCHC: 33.2 g/dL (ref 30.0–36.0)
MCV: 85.1 fl (ref 78.0–100.0)
Monocytes Absolute: 0.3 10*3/uL (ref 0.1–1.0)
Monocytes Relative: 8.9 % (ref 3.0–12.0)
Neutro Abs: 1.4 10*3/uL (ref 1.4–7.7)
Neutrophils Relative %: 43.3 % (ref 43.0–77.0)
Platelets: 208 10*3/uL (ref 150.0–400.0)
RBC: 4.63 Mil/uL (ref 3.87–5.11)
RDW: 13.7 % (ref 11.5–15.5)
WBC: 3.3 10*3/uL — ABNORMAL LOW (ref 4.0–10.5)

## 2023-02-01 LAB — COMPREHENSIVE METABOLIC PANEL
ALT: 27 U/L (ref 0–35)
AST: 23 U/L (ref 0–37)
Albumin: 4.6 g/dL (ref 3.5–5.2)
Alkaline Phosphatase: 52 U/L (ref 39–117)
BUN: 17 mg/dL (ref 6–23)
CO2: 29 mEq/L (ref 19–32)
Calcium: 9.2 mg/dL (ref 8.4–10.5)
Chloride: 106 mEq/L (ref 96–112)
Creatinine, Ser: 0.72 mg/dL (ref 0.40–1.20)
GFR: 92.87 mL/min (ref >60.00)
Glucose, Bld: 100 mg/dL — ABNORMAL HIGH (ref 70–99)
Potassium: 4.1 mEq/L (ref 3.5–5.1)
Sodium: 142 mEq/L (ref 135–145)
Total Bilirubin: 0.4 mg/dL (ref 0.2–1.2)
Total Protein: 7.1 g/dL (ref 6.0–8.3)

## 2023-02-01 LAB — TSH: TSH: 0.8 u[IU]/mL (ref 0.35–5.50)

## 2023-02-01 LAB — TB SKIN TEST
Induration: 0 mm
TB Skin Test: NEGATIVE

## 2023-02-01 LAB — LIPID PANEL
Cholesterol: 191 mg/dL (ref 0–200)
HDL: 73.1 mg/dL (ref >39.00)
LDL Cholesterol: 104 mg/dL — ABNORMAL HIGH (ref 0–99)
NonHDL: 117.7
Total CHOL/HDL Ratio: 3
Triglycerides: 68 mg/dL (ref 0.0–149.0)
VLDL: 13.6 mg/dL (ref 0.0–40.0)

## 2023-02-01 LAB — VITAMIN D 25 HYDROXY (VIT D DEFICIENCY, FRACTURES): VITD: 30.14 ng/mL (ref 30.00–100.00)

## 2023-02-01 NOTE — Progress Notes (Signed)
PPD Reading Note  PPD read and results entered in EpicCare.  Result: 0 mm induration.  Interpretation: Negative  If test not read within 48-72 hours of initial placement, patient advised to repeat in other arm 1-3 weeks after this test.  Allergic reaction: no

## 2023-02-02 LAB — HEPATITIS C ANTIBODY: Hepatitis C Ab: NONREACTIVE

## 2023-02-02 LAB — HEPATITIS B SURFACE ANTIBODY, QUANTITATIVE: Hep B S AB Quant (Post): <5 m[IU]/mL — ABNORMAL LOW (ref >=10)

## 2023-02-02 LAB — MEASLES/MUMPS/RUBELLA IMMUNITY
Mumps IgG: 225 AU/mL
Rubella: 1.4 Index
Rubeola IgG: 126 AU/mL

## 2023-02-06 ENCOUNTER — Telehealth: Payer: Self-pay

## 2023-02-06 NOTE — Telephone Encounter (Signed)
LVM for pt  to inform her her paperwork is completed and  to see if she needed me to fax her paperwork or did she want to come back and pick it up.

## 2023-02-08 ENCOUNTER — Encounter: Payer: Self-pay | Admitting: *Deleted

## 2023-02-08 ENCOUNTER — Telehealth: Payer: Self-pay | Admitting: *Deleted

## 2023-02-08 DIAGNOSIS — R898 Other abnormal findings in specimens from other organs, systems and tissues: Secondary | ICD-10-CM

## 2023-02-08 NOTE — Telephone Encounter (Signed)
Result Notes   Warden Fillers, [facility] 02/08/2023 10:40 AM EDT Back to Top    Left voicemail to return call. Please schedule non-fasting lab appt in 6-8 weeks (order placed)   Allegra Grana, FNP 02/06/2023  8:19 AM EDT     I have completed school form, given to jenate, please find out how she would like to pick up See result note   Please order cbc w diff and schedule in 6-8 wks

## 2023-03-07 ENCOUNTER — Telehealth: Payer: Self-pay | Admitting: Family

## 2023-03-07 NOTE — Telephone Encounter (Signed)
Patient need lab orders.

## 2023-03-15 LAB — HM MAMMOGRAPHY

## 2023-03-16 ENCOUNTER — Other Ambulatory Visit: Payer: BC Managed Care – PPO

## 2023-03-16 DIAGNOSIS — R898 Other abnormal findings in specimens from other organs, systems and tissues: Secondary | ICD-10-CM | POA: Diagnosis not present

## 2023-03-16 LAB — CBC WITH DIFFERENTIAL/PLATELET
Basophils Absolute: 0 10*3/uL (ref 0.0–0.1)
Basophils Relative: 0.7 % (ref 0.0–3.0)
Eosinophils Absolute: 0.1 10*3/uL (ref 0.0–0.7)
Eosinophils Relative: 3 % (ref 0.0–5.0)
HCT: 38.4 % (ref 36.0–46.0)
Hemoglobin: 12.5 g/dL (ref 12.0–15.0)
Lymphocytes Relative: 46 % (ref 12.0–46.0)
Lymphs Abs: 1.8 10*3/uL (ref 0.7–4.0)
MCHC: 32.6 g/dL (ref 30.0–36.0)
MCV: 85.7 fl (ref 78.0–100.0)
Monocytes Absolute: 0.3 10*3/uL (ref 0.1–1.0)
Monocytes Relative: 7 % (ref 3.0–12.0)
Neutro Abs: 1.7 10*3/uL (ref 1.4–7.7)
Neutrophils Relative %: 43.3 % (ref 43.0–77.0)
Platelets: 206 10*3/uL (ref 150.0–400.0)
RBC: 4.48 Mil/uL (ref 3.87–5.11)
RDW: 13.8 % (ref 11.5–15.5)
WBC: 3.9 10*3/uL — ABNORMAL LOW (ref 4.0–10.5)

## 2023-08-28 ENCOUNTER — Other Ambulatory Visit: Payer: Self-pay | Admitting: Family

## 2023-08-28 DIAGNOSIS — K219 Gastro-esophageal reflux disease without esophagitis: Secondary | ICD-10-CM

## 2023-08-30 ENCOUNTER — Other Ambulatory Visit: Payer: Self-pay | Admitting: Family

## 2023-08-30 DIAGNOSIS — I10 Essential (primary) hypertension: Secondary | ICD-10-CM

## 2023-12-10 ENCOUNTER — Encounter: Payer: Self-pay | Admitting: Family

## 2023-12-10 ENCOUNTER — Telehealth: Payer: Self-pay

## 2023-12-10 NOTE — Telephone Encounter (Signed)
 I left a voicemail for patient letting her know that we have rescheduled her appointment with Bascom Bossier, FNP, on 02/04/2024 from 9am to 11am.  I asked her to please call and let us  know if this will not work with her schedule.  I also sent a letter to patient via MyChart.

## 2023-12-24 ENCOUNTER — Telehealth: Payer: Self-pay

## 2023-12-24 ENCOUNTER — Encounter: Payer: Self-pay | Admitting: Family

## 2023-12-24 NOTE — Telephone Encounter (Signed)
 Copied from CRM [id number]. Topic: Clinical - Medication Question >> Dec 24, 2023 11:09 AM Alyse July wrote: Reason for CRM: Patient would like a prescription for a Malaria pill and an antibiotic sent to [facility] located at  [address] Patient has a trip to [place] scheduled for January 14, 2024. Please contact patient to advise if unable to prescribe medication or if an appointment is needed prior to. Patient is okay with response being placed in MyChart.

## 2023-12-24 NOTE — Telephone Encounter (Signed)
 I sent pt a mychart message re [facility] Please ensure that she received

## 2023-12-25 NOTE — Telephone Encounter (Signed)
 Left message to call the office back to find out if the patient received the my chart message from [facility].

## 2023-12-25 NOTE — Telephone Encounter (Signed)
 LVM to call back to make sure pt saw my chart message from [facility]

## 2023-12-27 NOTE — Telephone Encounter (Signed)
 Pt has responded via mychart.

## 2024-01-04 LAB — HM COLONOSCOPY

## 2024-02-04 ENCOUNTER — Ambulatory Visit: Payer: Self-pay | Admitting: Family

## 2024-02-04 ENCOUNTER — Encounter: Payer: Self-pay | Admitting: Family

## 2024-02-20 ENCOUNTER — Other Ambulatory Visit: Payer: Self-pay | Admitting: Family

## 2024-02-20 DIAGNOSIS — I10 Essential (primary) hypertension: Secondary | ICD-10-CM

## 2024-05-26 LAB — HM MAMMOGRAPHY

## 2024-05-27 ENCOUNTER — Ambulatory Visit: Admitting: Family

## 2024-05-27 ENCOUNTER — Encounter: Payer: Self-pay | Admitting: Family

## 2024-05-27 VITALS — BP 130/70 | HR 76 | Temp 98.0°F | Ht 62.0 in | Wt 113.4 lb

## 2024-05-27 DIAGNOSIS — Z23 Encounter for immunization: Secondary | ICD-10-CM

## 2024-05-27 DIAGNOSIS — Z0001 Encounter for general adult medical examination with abnormal findings: Secondary | ICD-10-CM

## 2024-05-27 DIAGNOSIS — R519 Headache, unspecified: Secondary | ICD-10-CM | POA: Diagnosis not present

## 2024-05-27 DIAGNOSIS — Z13220 Encounter for screening for lipoid disorders: Secondary | ICD-10-CM

## 2024-05-27 DIAGNOSIS — Z0000 Encounter for general adult medical examination without abnormal findings: Secondary | ICD-10-CM

## 2024-05-27 LAB — CBC WITH DIFFERENTIAL/PLATELET
Basophils Absolute: 0 K/uL (ref 0.0–0.1)
Basophils Relative: 1.2 % (ref 0.0–3.0)
Eosinophils Absolute: 0.1 K/uL (ref 0.0–0.7)
Eosinophils Relative: 1.9 % (ref 0.0–5.0)
HCT: 41 % (ref 36.0–46.0)
Hemoglobin: 13.5 g/dL (ref 12.0–15.0)
Lymphocytes Relative: 39.4 % (ref 12.0–46.0)
Lymphs Abs: 1.2 K/uL (ref 0.7–4.0)
MCHC: 32.8 g/dL (ref 30.0–36.0)
MCV: 82 fl (ref 78.0–100.0)
Monocytes Absolute: 0.3 K/uL (ref 0.1–1.0)
Monocytes Relative: 8.3 % (ref 3.0–12.0)
Neutro Abs: 1.5 K/uL (ref 1.4–7.7)
Neutrophils Relative %: 49.2 % (ref 43.0–77.0)
Platelets: 241 K/uL (ref 150.0–400.0)
RBC: 5 Mil/uL (ref 3.87–5.11)
RDW: 14.4 % (ref 11.5–15.5)
WBC: 3.1 K/uL — ABNORMAL LOW (ref 4.0–10.5)

## 2024-05-27 LAB — COMPREHENSIVE METABOLIC PANEL WITH GFR
ALT: 19 U/L (ref 0–35)
AST: 20 U/L (ref 0–37)
Albumin: 5 g/dL (ref 3.5–5.2)
Alkaline Phosphatase: 62 U/L (ref 39–117)
BUN: 13 mg/dL (ref 6–23)
CO2: 31 meq/L (ref 19–32)
Calcium: 9.1 mg/dL (ref 8.4–10.5)
Chloride: 101 meq/L (ref 96–112)
Creatinine, Ser: 0.75 mg/dL (ref 0.40–1.20)
GFR: 87.62 mL/min (ref >60.00)
Glucose, Bld: 103 mg/dL — ABNORMAL HIGH (ref 70–99)
Potassium: 3.9 meq/L (ref 3.5–5.1)
Sodium: 140 meq/L (ref 135–145)
Total Bilirubin: 0.6 mg/dL (ref 0.2–1.2)
Total Protein: 7 g/dL (ref 6.0–8.3)

## 2024-05-27 LAB — LIPID PANEL
Cholesterol: 211 mg/dL — ABNORMAL HIGH (ref 0–200)
HDL: 79.7 mg/dL (ref >39.00)
LDL Cholesterol: 114 mg/dL — ABNORMAL HIGH (ref 0–99)
NonHDL: 131.65
Total CHOL/HDL Ratio: 3
Triglycerides: 86 mg/dL (ref 0.0–149.0)
VLDL: 17.2 mg/dL (ref 0.0–40.0)

## 2024-05-27 LAB — VITAMIN D 25 HYDROXY (VIT D DEFICIENCY, FRACTURES): VITD: 30.11 ng/mL (ref 30.00–100.00)

## 2024-05-27 LAB — B12 AND FOLATE PANEL
Folate: >22.9 ng/mL (ref >5.9)
Vitamin B-12: 344 pg/mL (ref 211–911)

## 2024-05-27 LAB — HEMOGLOBIN A1C: Hgb A1c MFr Bld: 5.6 % (ref 4.6–6.5)

## 2024-05-27 LAB — TSH: TSH: 0.9 u[IU]/mL (ref 0.35–5.50)

## 2024-05-27 NOTE — Progress Notes (Unsigned)
 Assessment & Plan:  Annual physical exam Assessment & Plan: Unable to see colonoscopy, endoscopy.  Patient will send me those records from 12/2023.  Deferred pelvic exam due to history; she continues following with [facility] for annual pelvic exam.  Encouraged more formal exercise outside of work.  Orders: -     VITAMIN D  25 Hydroxy (Vit-D Deficiency, Fractures) -     Hemoglobin A1c -     CBC with Differential/Platelet -     Comprehensive metabolic panel with GFR -     Lipid panel -     TSH  Nonintractable headache, unspecified chronicity pattern, unspecified headache type Assessment & Plan: Atypical headache presentation.  Headache is positional without sinus congestion.  Reassuring neurologic and HEENT exam.  Headache is not similar to previous migraine years ago.  History of Lynch syndrome.  Pending MRI brain, labs.  Advise trial of magnesium citrate 400mg   OTC.  Close follow-up  Orders: -     B12 and Folate Panel -     MR BRAIN W WO CONTRAST; Future  Need for influenza vaccination -     Flu vaccine trivalent PF, 6mos and older(Flulaval,Afluria,Fluarix,Fluzone)     Return precautions given.   Risks, benefits, and alternatives of the medications and treatment plan prescribed today were discussed, and patient expressed understanding.   Education regarding symptom management and diagnosis given to patient on AVS either electronically or printed.  Return in about 6 weeks (around 07/08/2024).  Rollene Northern, FNP  Subjective:    Patient ID: Katherine Sharp, female    DOB: 13-Dec-1965, 58 y.o.   MRN: [id number]  CC: Katherine Sharp is a 58 y.o. female who presents today for physical exam.    HPI: HPI Discussed the use of AI scribe software for clinical note transcription with the patient, who gave verbal consent to proceed.  History of Present Illness   Katherine Sharp is a 58 year old female with Lynch syndrome who presents with concerns of unusual head sensations and  headaches.  She is also here today for her annual physical exam.  She experiences a tapping sensation on the posterior side of her head and a separate tingling sensation on the left side of her face. These sensations occurred weeks apart and are not concurrent. The facial tingling feels like sensitive nerve endings but is not painful. She is concerned about these symptoms due to her history of Lynch syndrome .  She describes dull headaches that become excruciating when leaning over, resembling sinus headaches. These headaches occur at work, and she considers dehydration as a possible cause. Denies associated sinus congestion, ear pain, facial pressure, or fever. She has a history of migraines but has not experienced them in over 20 years.   She underwent an upper and lower endoscopy in May, which revealed polyps in her stomach. She has a history of annual colonoscopies and biennial upper endoscopies due to Lynch syndrome. She was taken off Protonix .  She experiences occasional numbness in her fingers and toes, which she attributes to pinched nerves or improper footwear. She has not had her B12 levels tested before.  Her family history includes her biological mother having a brain tumor at age 52, and her grandmother having multiple cancers, including bladder, uterine, and breast cancer.   She is a Runner, broadcasting/film/video.    H/o lynch syndrome. She has annual skin exam, colonoscopy.   Colorectal Cancer Screening: UTD 12/13/23 , endoscopy 12/13/23 ; unable to see results in Epic  Breast Cancer Screening: Mammogram UTD, 03/16/23 with [facility]; she reports that she had mammogram yesterday ( we do not have records)  Cervical Cancer Screening: h/o hysterectomy, bso, sln 07/2019; no longer following with Dr Karilyn GYN [facility].   Bone Health screening/DEXA for 65+: She reports DEXA down with GYN.   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   Alcohol use:  rare Smoking/tobacco use: Nonsmoker.    Health Maintenance  Topic Date Due   Hepatitis B Vaccine (1 of 3 - 19+ 3-dose series) Never done   Zoster (Shingles) Vaccine (1 of 2) Never done   Pap with HPV screening  Never done   COVID-19 Vaccine (4 - 2025-26 season) 04/14/2024   Pneumococcal Vaccine for age over 64 (1 of 2 - PCV) 05/27/2025*   Colon Cancer Screening  01/03/2025   Breast Cancer Screening  05/26/2026   DTaP/Tdap/Td vaccine (2 - Td or Tdap) 01/28/2033   Flu Shot  Completed   Hepatitis C Screening  Completed   HIV Screening  Completed   HPV Vaccine  Aged Out   Meningitis B Vaccine  Aged Out  *Topic was postponed. The date shown is not the original due date.    ALLERGIES: Nsaids  Current Outpatient Medications on File Prior to Visit  Medication Sig Dispense Refill   albuterol  (VENTOLIN  HFA) 108 (90 Base) MCG/ACT inhaler Inhale 2 puffs into the lungs every 6 (six) hours as needed.     ALPRAZolam (XANAX) 0.25 MG tablet Take 0.25 mg by mouth 2 (two) times daily as needed.     amLODipine  (NORVASC ) 10 MG tablet TAKE 1 TABLET(10 MG) BY MOUTH DAILY 90 tablet 3   amphetamine-dextroamphetamine (ADDERALL XR) 15 MG 24 hr capsule Take by mouth every morning.     calcium carbonate (TUMS - DOSED IN MG ELEMENTAL CALCIUM) 500 MG chewable tablet Chew 1 tablet by mouth as needed for indigestion or heartburn.     escitalopram (LEXAPRO) 20 MG tablet Take 1 tablet by mouth daily.     hydrocortisone 2.5 % lotion Apply topically 2 (two) times daily as needed.     lisinopril  (ZESTRIL ) 20 MG tablet TAKE 1 TABLET(20 MG) BY MOUTH DAILY 90 tablet 3   Multiple Vitamins-Minerals (MULTIVITAMIN ADULT PO) Take by mouth daily.     No current facility-administered medications on file prior to visit.    Review of Systems  Constitutional:  Negative for chills and fever.  HENT:  Negative for congestion.    Respiratory:  Negative for cough.   Cardiovascular:  Negative for chest pain and palpitations.  Gastrointestinal:  Negative for nausea and vomiting.  Neurological:  Positive for headaches. Negative for numbness (left side of face, none today).      Objective:    BP 130/70   Pulse 76   Temp 98 F (36.7 C) (Oral)   Ht 5' 2 (1.575 m)   Wt 113 lb 6.4 oz (51.4 kg)   LMP 07/20/2016 (Exact Date)   SpO2 97%   BMI 20.74 kg/m   BP Readings from Last 3 Encounters:  05/27/24 130/70  01/29/23 132/76  09/01/22 130/78   Wt Readings from Last 3 Encounters:  05/27/24 113 lb 6.4 oz (51.4 kg)  01/29/23 118 lb 9.6 oz (53.8 kg)  09/01/22 118 lb 3.2 oz (53.6 kg)    Physical Exam Vitals reviewed.  Constitutional:      Appearance: She is well-developed.  HENT:     Head: Normocephalic and atraumatic.     Right Ear: Hearing, tympanic membrane, ear canal and external ear normal. No swelling or tenderness. No middle ear effusion. Tympanic membrane is not erythematous or bulging.     Left Ear: Tympanic membrane, ear canal and external ear normal. No swelling or tenderness.  No middle ear effusion. Tympanic membrane is not erythematous or bulging.     Nose: Nose normal. No rhinorrhea.     Right Sinus: No maxillary sinus tenderness or frontal sinus tenderness.     Left Sinus: No maxillary sinus tenderness or frontal sinus tenderness.     Mouth/Throat:     Pharynx: Uvula midline. No posterior oropharyngeal erythema.  Eyes:     General: Lids are normal. Lids are everted, no foreign bodies appreciated.     Conjunctiva/sclera: Conjunctivae normal.     Pupils: Pupils are equal, round, and reactive to light.     Comments: Normal fundus bilaterally   Cardiovascular:     Rate and Rhythm: Normal rate and regular rhythm.     Pulses: Normal pulses.     Heart sounds: Normal heart sounds.  Pulmonary:     Effort: Pulmonary effort is normal.     Breath sounds: Normal breath sounds. No wheezing, rhonchi or  rales.  Lymphadenopathy:     Head:     Right side of head: No submental, submandibular, tonsillar, preauricular, posterior auricular or occipital adenopathy.     Left side of head: No submental, submandibular, tonsillar, preauricular, posterior auricular or occipital adenopathy.     Cervical: No cervical adenopathy.     Right cervical: No superficial, deep or posterior cervical adenopathy.    Left cervical: No superficial, deep or posterior cervical adenopathy.  Skin:    General: Skin is warm and dry.  Neurological:     Mental Status: She is alert.     Cranial Nerves: No cranial nerve deficit.     Sensory: No sensory deficit.     Deep Tendon Reflexes:     Reflex Scores:      Bicep reflexes are 2+ on the right side and 2+ on the left side.      Patellar reflexes are 2+ on the right side and 2+ on the left side.    Comments: Grip equal and strong bilateral upper extremities. Gait strong and steady. Able to perform  finger-to-nose without difficulty.   Psychiatric:        Speech: Speech normal.        Behavior: Behavior normal.        Thought Content: Thought content normal.

## 2024-05-27 NOTE — Patient Instructions (Addendum)
 Keep symptom log as discussed  I ordered MRI brain.   Start magnesium citrate 400mg  daily for headache.   Please let me know how you are doing.    For post menopausal women, guidelines recommend a diet with 1200 mg of Calcium per day. If you are eating calcium rich foods, you do not need a calcium supplement. The body better absorbs the calcium that you eat over supplementation. If you do supplement, I recommend not supplementing the full 1200 mg/ day as this can lead to increased risk of cardiovascular disease. I recommend Calcium Citrate over the counter, and you may take a total of 600 per day in divided doses with meals for best absorption.   For bone health, you need adequate vitamin D , and I recommend you supplement as it is harder to do so with diet alone. I recommend cholecalciferol 800 units daily.  Also, please ensure you are following a diet high in calcium -- research shows better outcomes with dietary sources including kale, yogurt, broccolii, cheese, okra, almonds- to name a few.     Also remember that exercise is a great medicine for maintain and preserve bone health. Advise moderate exercise for 30 minutes , 3 times per week.  Health Maintenance for Postmenopausal Women Menopause is a normal process in which your ability to get pregnant comes to an end. This process happens slowly over many months or years, usually between the ages of 59 and 41. Menopause is complete when you have missed your menstrual period for 12 months. It is important to talk with your health care provider about some of the most common conditions that affect women after menopause (postmenopausal women). These include heart disease, cancer, and bone loss (osteoporosis). Adopting a healthy lifestyle and getting preventive care can help to promote your health and wellness. The actions you take can also lower your chances of developing some of these common conditions. What are the signs and symptoms of  menopause? During menopause, you may have the following symptoms: Hot flashes. These can be moderate or severe. Night sweats. Decrease in sex drive. Mood swings. Headaches. Tiredness (fatigue). Irritability. Memory problems. Problems falling asleep or staying asleep. Talk with your health care provider about treatment options for your symptoms. Do I need hormone replacement therapy? Hormone replacement therapy is effective in treating symptoms that are caused by menopause, such as hot flashes and night sweats. Hormone replacement carries certain risks, especially as you become older. If you are thinking about using estrogen or estrogen with progestin, discuss the benefits and risks with your health care provider. How can I reduce my risk for heart disease and stroke? The risk of heart disease, heart attack, and stroke increases as you age. One of the causes may be a change in the body's hormones during menopause. This can affect how your body uses dietary fats, triglycerides, and cholesterol. Heart attack and stroke are medical emergencies. There are many things that you can do to help prevent heart disease and stroke. Watch your blood pressure High blood pressure causes heart disease and increases the risk of stroke. This is more likely to develop in people who have high blood pressure readings or are overweight. Have your blood pressure checked: Every 3-5 years if you are 60-18 years of age. Every year if you are 46 years old or older. Eat a healthy diet  Eat a diet that includes plenty of vegetables, fruits, low-fat dairy products, and lean protein. Do not eat a lot of foods that  are high in solid fats, added sugars, or sodium. Get regular exercise Get regular exercise. This is one of the most important things you can do for your health. Most adults should: Try to exercise for at least 150 minutes each week. The exercise should increase your heart rate and make you sweat  (moderate-intensity exercise). Try to do strengthening exercises at least twice each week. Do these in addition to the moderate-intensity exercise. Spend less time sitting. Even light physical activity can be beneficial. Other tips Work with your health care provider to achieve or maintain a healthy weight. Do not use any products that contain nicotine or tobacco. These products include cigarettes, chewing tobacco, and vaping devices, such as e-cigarettes. If you need help quitting, ask your health care provider. Know your numbers. Ask your health care provider to check your cholesterol and your blood sugar (glucose). Continue to have your blood tested as directed by your health care provider. Do I need screening for cancer? Depending on your health history and family history, you may need to have cancer screenings at different stages of your life. This may include screening for: Breast cancer. Cervical cancer. Lung cancer. Colorectal cancer. What is my risk for osteoporosis? After menopause, you may be at increased risk for osteoporosis. Osteoporosis is a condition in which bone destruction happens more quickly than new bone creation. To help prevent osteoporosis or the bone fractures that can happen because of osteoporosis, you may take the following actions: If you are 82-42 years old, get at least 1,000 mg of calcium and at least 600 international units (IU) of vitamin D  per day. If you are older than age 49 but younger than age 12, get at least 1,200 mg of calcium and at least 600 international units (IU) of vitamin D  per day. If you are older than age 69, get at least 1,200 mg of calcium and at least 800 international units (IU) of vitamin D  per day. Smoking and drinking excessive alcohol increase the risk of osteoporosis. Eat foods that are rich in calcium and vitamin D , and do weight-bearing exercises several times each week as directed by your health care provider. How does menopause  affect my mental health? Depression may occur at any age, but it is more common as you become older. Common symptoms of depression include: Feeling depressed. Changes in sleep patterns. Changes in appetite or eating patterns. Feeling an overall lack of motivation or enjoyment of activities that you previously enjoyed. Frequent crying spells. Talk with your health care provider if you think that you are experiencing any of these symptoms. General instructions See your health care provider for regular wellness exams and vaccines. This may include: Scheduling regular health, dental, and eye exams. Getting and maintaining your vaccines. These include: Influenza vaccine. Get this vaccine each year before the flu season begins. Pneumonia vaccine. Shingles vaccine. Tetanus, diphtheria, and pertussis (Tdap) booster vaccine. Your health care provider may also recommend other immunizations. Tell your health care provider if you have ever been abused or do not feel safe at home. Summary Menopause is a normal process in which your ability to get pregnant comes to an end. This condition causes hot flashes, night sweats, decreased interest in sex, mood swings, headaches, or lack of sleep. Treatment for this condition may include hormone replacement therapy. Take actions to keep yourself healthy, including exercising regularly, eating a healthy diet, watching your weight, and checking your blood pressure and blood sugar levels. Get screened for cancer and depression. Make  sure that you are up to date with all your vaccines. This information is not intended to replace advice given to you by your health care provider. Make sure you discuss any questions you have with your health care provider. Document Revised: 12/20/2020 Document Reviewed: 12/20/2020 Elsevier Patient Education  2024 [employer].

## 2024-05-29 ENCOUNTER — Other Ambulatory Visit: Payer: Self-pay | Admitting: Family

## 2024-05-29 ENCOUNTER — Encounter: Payer: Self-pay | Admitting: Family

## 2024-05-29 DIAGNOSIS — I10 Essential (primary) hypertension: Secondary | ICD-10-CM

## 2024-05-29 NOTE — Assessment & Plan Note (Addendum)
 Atypical headache presentation.  Headache is positional without sinus congestion.  Reassuring neurologic and HEENT exam.  Headache is not similar to previous migraine years ago.  History of Lynch syndrome.  Pending MRI brain, labs.  Advise trial of magnesium citrate 400mg   OTC.  Close follow-up

## 2024-05-29 NOTE — Assessment & Plan Note (Addendum)
 Unable to see colonoscopy, endoscopy.  Patient will send me those records from 12/2023.  Deferred pelvic exam due to history; she continues following with [facility] for annual pelvic exam.  Encouraged more formal exercise outside of work.

## 2024-06-11 ENCOUNTER — Ambulatory Visit: Payer: Self-pay | Admitting: Family

## 2024-06-11 DIAGNOSIS — R899 Unspecified abnormal finding in specimens from other organs, systems and tissues: Secondary | ICD-10-CM

## 2024-06-24 ENCOUNTER — Ambulatory Visit
Admission: RE | Admit: 2024-06-24 | Discharge: 2024-06-24 | Disposition: A | Discharge status: Home / Self Care | Source: Ambulatory Visit | Attending: Family | Admitting: Family

## 2024-06-24 DIAGNOSIS — R519 Headache, unspecified: Secondary | ICD-10-CM | POA: Insufficient documentation

## 2024-06-24 MED ORDER — GADOBUTROL 1 MMOL/ML IV SOLN
5.0000 mL | Freq: Once | INTRAVENOUS | Status: AC | PRN
  Administered 2024-06-24: 5 mL via INTRAVENOUS

## 2024-07-08 ENCOUNTER — Telehealth (INDEPENDENT_AMBULATORY_CARE_PROVIDER_SITE_OTHER): Admitting: Family

## 2024-07-08 DIAGNOSIS — G43809 Other migraine, not intractable, without status migrainosus: Secondary | ICD-10-CM

## 2024-07-08 DIAGNOSIS — R200 Anesthesia of skin: Secondary | ICD-10-CM | POA: Diagnosis not present

## 2024-07-08 NOTE — Progress Notes (Unsigned)
 Virtual Visit via Video Note  I connected with Katherine Sharp on 07/10/24 at 12:00 PM EST by a video enabled telemedicine application and verified that I am speaking with the correct person using two identifiers. Location patient: home Location provider: work  Persons participating in the virtual visit: patient, provider  I discussed the limitations of evaluation and management by telemedicine and the availability of in person appointments. The patient expressed understanding and agreed to proceed.  HPI: Follow-up headache, review MRI Brain. She will occasionally get a headache when she 'bends forward'.  Endorses sinus congestion. She has started claritin with improvement with PND and less congestion.  One episode of tapping on head sensation since our last appointment.  She started magnesium citrate 300mg  however had caused GI side effects such as increased gas.   She has started b12 drop 5000 mcg per 1mL   She complains of left shoulder numbness and left 5th finger numbness and pain Associated left shoulder pain when picking up object and left posterior scapula pain.  She is wearing wrist splint.   H/o CTS bilaterally  No trauma, reduction in ROM.    MRI brain no acute intracranial normality.  Mild chronic microvascular ischemic changes, mucosal thickening in the sphenoid sinuses  Consult with Dr Joane 05/2020  for neck and shoulder pain.  Ordered PT; discussed MRI cervical , considered ESI; started tizanidine   Xr Cervical spine 01/2020  No fracture or static subluxation of the cervical spine. Focally moderate disc space height loss and osteophytosis of C4-C5 with otherwise preserved disc spaces.  ROS: See pertinent positives and negatives per HPI.  EXAM:  VITALS per patient if applicable: LMP 07/20/2016 (Exact Date)  BP Readings from Last 3 Encounters:  05/27/24 130/70  01/29/23 132/76  09/01/22 130/78   Wt Readings from Last 3 Encounters:  05/27/24 113 lb 6.4 oz  (51.4 kg)  01/29/23 118 lb 9.6 oz (53.8 kg)  09/01/22 118 lb 3.2 oz (53.6 kg)    GENERAL: alert, oriented, appears well and in no acute distress  HEENT: atraumatic, conjunttiva clear, no obvious abnormalities on inspection of external nose and ears  NECK: normal movements of the head and neck  LUNGS: on inspection no signs of respiratory distress, breathing rate appears normal, no obvious gross SOB, gasping or wheezing  CV: no obvious cyanosis  MS: moves all visible extremities without noticeable abnormality. Able to raise left arm to left ear.  PSYCH/NEURO: pleasant and cooperative, no obvious depression or anxiety, speech and thought processing grossly intact  ASSESSMENT AND PLAN: Left arm numbness Assessment & Plan: Radicular symptoms starting at shoulder to left fifth finger.  Pending B12, MRI cervical spine and referral to physical therapy.  Reviewed prior sports medicine consult with Dr. Darleene.  Gabapentin  was previously not effective.  She prefers to take over-the-counter NSAID if needed.  Orders: -     MR CERVICAL SPINE WO CONTRAST; Future -     B12 and Folate Panel; Future -     Ambulatory referral to Physical Therapy  Other migraine without status migrainosus, not intractable Assessment & Plan: Chronic, stable to improved.  The sensory sensation of tapping has not recurred.  She does have occasional headache which appears to be positional , coinciding with sinus congestion.  Advised to continue Claritin.    Advised trial of magnesium glycinate instead of magnesium citrate.  Close follow-up.      -we discussed possible serious and likely etiologies, options for evaluation and workup, limitations of telemedicine  visit vs in person visit, treatment, treatment risks and precautions. Pt prefers to treat via telemedicine empirically rather then risking or undertaking an in person visit at this moment.    I discussed the assessment and treatment plan with the patient. The  patient was provided an opportunity to ask questions and all were answered. The patient agreed with the plan and demonstrated an understanding of the instructions.   The patient was advised to call back or seek an in-person evaluation if the symptoms worsen or if the condition fails to improve as anticipated.  Advised if desired AVS can be mailed or viewed via MyChart if Mychart user.   Rollene Northern, FNP

## 2024-07-08 NOTE — Patient Instructions (Addendum)
 Call to schedule non fasting lab    Magnesium glycinate can better tolerated by GI system but it is hard to find (may have to look up online or health food store), 400-600mg  daily.   Continue b12.    Referral for MRI cervical spine and Physical therapy.    Please let me know how you are doing.

## 2024-07-10 NOTE — Assessment & Plan Note (Signed)
 Radicular symptoms starting at shoulder to left fifth finger.  Pending B12, MRI cervical spine and referral to physical therapy.  Reviewed prior sports medicine consult with Dr. Darleene.  Gabapentin  was previously not effective.  She prefers to take over-the-counter NSAID if needed.

## 2024-07-10 NOTE — Assessment & Plan Note (Signed)
 Chronic, stable to improved.  The sensory sensation of tapping has not recurred.  She does have occasional headache which appears to be positional , coinciding with sinus congestion.  Advised to continue Claritin.    Advised trial of magnesium glycinate instead of magnesium citrate.  Close follow-up.
